# Patient Record
Sex: Female | Born: 2000 | Race: Black or African American | Hispanic: No | Marital: Single | Smoking: Never smoker
Health system: Southern US, Community
[De-identification: ages and names within clinical notes are randomized; demographics above are authoritative.]

## PROBLEM LIST (undated history)

## (undated) ENCOUNTER — Emergency Department (HOSPITAL_COMMUNITY): Payer: Medicaid Other

## (undated) HISTORY — PX: TONSILLECTOMY: SHX5217

---
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
## 2022-03-02 ENCOUNTER — Emergency Department (HOSPITAL_BASED_OUTPATIENT_CLINIC_OR_DEPARTMENT_OTHER): Payer: Self-pay

## 2022-03-02 ENCOUNTER — Encounter (HOSPITAL_BASED_OUTPATIENT_CLINIC_OR_DEPARTMENT_OTHER): Payer: Self-pay

## 2022-03-02 ENCOUNTER — Other Ambulatory Visit: Payer: Self-pay

## 2022-03-02 ENCOUNTER — Inpatient Hospital Stay (HOSPITAL_BASED_OUTPATIENT_CLINIC_OR_DEPARTMENT_OTHER)
Admission: EM | Admit: 2022-03-02 | Discharge: 2022-03-05 | DRG: 419 | Disposition: A | Discharge status: Home / Self Care | Payer: Self-pay | Attending: Internal Medicine | Admitting: Internal Medicine

## 2022-03-02 DIAGNOSIS — D509 Iron deficiency anemia, unspecified: Secondary | ICD-10-CM | POA: Diagnosis present

## 2022-03-02 DIAGNOSIS — K8012 Calculus of gallbladder with acute and chronic cholecystitis without obstruction: Principal | ICD-10-CM | POA: Diagnosis present

## 2022-03-02 DIAGNOSIS — R7401 Elevation of levels of liver transaminase levels: Secondary | ICD-10-CM | POA: Diagnosis present

## 2022-03-02 DIAGNOSIS — E876 Hypokalemia: Secondary | ICD-10-CM | POA: Diagnosis present

## 2022-03-02 DIAGNOSIS — Z68.41 Body mass index (BMI) pediatric, greater than or equal to 95th percentile for age: Secondary | ICD-10-CM

## 2022-03-02 DIAGNOSIS — K219 Gastro-esophageal reflux disease without esophagitis: Secondary | ICD-10-CM | POA: Diagnosis present

## 2022-03-02 DIAGNOSIS — Z79899 Other long term (current) drug therapy: Secondary | ICD-10-CM

## 2022-03-02 DIAGNOSIS — R1011 Right upper quadrant pain: Secondary | ICD-10-CM | POA: Diagnosis present

## 2022-03-02 DIAGNOSIS — I10 Essential (primary) hypertension: Secondary | ICD-10-CM | POA: Diagnosis present

## 2022-03-02 DIAGNOSIS — R7989 Other specified abnormal findings of blood chemistry: Secondary | ICD-10-CM

## 2022-03-02 DIAGNOSIS — K8020 Calculus of gallbladder without cholecystitis without obstruction: Principal | ICD-10-CM

## 2022-03-02 DIAGNOSIS — Z888 Allergy status to other drugs, medicaments and biological substances status: Secondary | ICD-10-CM

## 2022-03-02 DIAGNOSIS — J4530 Mild persistent asthma, uncomplicated: Secondary | ICD-10-CM | POA: Diagnosis present

## 2022-03-02 LAB — CBC WITH DIFFERENTIAL/PLATELET
Abs Immature Granulocytes: 0.02 10*3/uL (ref 0.00–0.07)
Basophils Absolute: 0 10*3/uL (ref 0.0–0.1)
Basophils Relative: 0 %
Eosinophils Absolute: 0.1 10*3/uL (ref 0.0–0.5)
Eosinophils Relative: 1 %
HCT: 32 % — ABNORMAL LOW (ref 36.0–46.0)
Hemoglobin: 9.9 g/dL — ABNORMAL LOW (ref 12.0–15.0)
Immature Granulocytes: 0 %
Lymphocytes Relative: 18 %
Lymphs Abs: 1.1 10*3/uL (ref 0.7–4.0)
MCH: 22.9 pg — ABNORMAL LOW (ref 26.0–34.0)
MCHC: 30.9 g/dL (ref 30.0–36.0)
MCV: 73.9 fL — ABNORMAL LOW (ref 80.0–100.0)
Monocytes Absolute: 0.3 10*3/uL (ref 0.1–1.0)
Monocytes Relative: 6 %
Neutro Abs: 4.4 10*3/uL (ref 1.7–7.7)
Neutrophils Relative %: 75 %
Platelets: 291 10*3/uL (ref 150–400)
RBC: 4.33 MIL/uL (ref 3.87–5.11)
RDW: 15.9 % — ABNORMAL HIGH (ref 11.5–15.5)
WBC: 5.9 10*3/uL (ref 4.0–10.5)
nRBC: 0 % (ref 0.0–0.2)

## 2022-03-02 LAB — COMPREHENSIVE METABOLIC PANEL
ALT: 539 U/L — ABNORMAL HIGH (ref 0–44)
AST: 675 U/L — ABNORMAL HIGH (ref 15–41)
Albumin: 4.3 g/dL (ref 3.5–5.0)
Alkaline Phosphatase: 204 U/L — ABNORMAL HIGH (ref 38–126)
Anion gap: 9 (ref 5–15)
BUN: 7 mg/dL (ref 6–20)
CO2: 24 mmol/L (ref 22–32)
Calcium: 9.6 mg/dL (ref 8.9–10.3)
Chloride: 103 mmol/L (ref 98–111)
Creatinine, Ser: 0.58 mg/dL (ref 0.44–1.00)
GFR, Estimated: >60 mL/min (ref >60)
Glucose, Bld: 110 mg/dL — ABNORMAL HIGH (ref 70–99)
Potassium: 3.2 mmol/L — ABNORMAL LOW (ref 3.5–5.1)
Sodium: 136 mmol/L (ref 135–145)
Total Bilirubin: 3.5 mg/dL — ABNORMAL HIGH (ref 0.3–1.2)
Total Protein: 7.7 g/dL (ref 6.5–8.1)

## 2022-03-02 LAB — LIPID PANEL
Cholesterol: 127 mg/dL (ref 0–200)
HDL: 39 mg/dL — ABNORMAL LOW (ref >40)
LDL Cholesterol: 81 mg/dL (ref 0–99)
Total CHOL/HDL Ratio: 3.3 RATIO
Triglycerides: 35 mg/dL (ref <150)
VLDL: 7 mg/dL (ref 0–40)

## 2022-03-02 LAB — LIPASE, BLOOD: Lipase: 46 U/L (ref 11–51)

## 2022-03-02 LAB — HCG, SERUM, QUALITATIVE: Preg, Serum: NEGATIVE

## 2022-03-02 MED ORDER — TRAZODONE HCL 50 MG PO TABS
25.0000 mg | ORAL_TABLET | Freq: Every evening | ORAL | Status: DC | PRN
  Filled 2022-03-02: qty 1

## 2022-03-02 MED ORDER — SENNOSIDES-DOCUSATE SODIUM 8.6-50 MG PO TABS: 1.0000 | ORAL_TABLET | Freq: Every evening | ORAL | Status: DC | PRN

## 2022-03-02 MED ORDER — SODIUM CHLORIDE 0.9% FLUSH
3.0000 mL | Freq: Two times a day (BID) | INTRAVENOUS | Status: DC
  Administered 2022-03-02 – 2022-03-03 (×2): 3 mL via INTRAVENOUS

## 2022-03-02 MED ORDER — SODIUM CHLORIDE 0.9 % IV BOLUS
1000.0000 mL | Freq: Once | INTRAVENOUS | Status: AC
  Administered 2022-03-02: 1000 mL via INTRAVENOUS

## 2022-03-02 MED ORDER — POTASSIUM CHLORIDE CRYS ER 20 MEQ PO TBCR
20.0000 meq | EXTENDED_RELEASE_TABLET | Freq: Once | ORAL | Status: AC
  Administered 2022-03-02: 20 meq via ORAL
  Filled 2022-03-02: qty 1

## 2022-03-02 MED ORDER — HYDRALAZINE HCL 20 MG/ML IJ SOLN: 5.0000 mg | Freq: Four times a day (QID) | INTRAMUSCULAR | Status: DC | PRN

## 2022-03-02 MED ORDER — ONDANSETRON HCL 4 MG PO TABS: 4.0000 mg | ORAL_TABLET | Freq: Four times a day (QID) | ORAL | Status: DC | PRN

## 2022-03-02 MED ORDER — IBUPROFEN 400 MG PO TABS
400.0000 mg | ORAL_TABLET | Freq: Four times a day (QID) | ORAL | Status: DC | PRN
  Administered 2022-03-03: 400 mg via ORAL
  Filled 2022-03-02: qty 1

## 2022-03-02 MED ORDER — ONDANSETRON HCL 4 MG/2ML IJ SOLN
4.0000 mg | Freq: Four times a day (QID) | INTRAMUSCULAR | Status: DC | PRN
  Administered 2022-03-03: 4 mg via INTRAVENOUS
  Filled 2022-03-02: qty 2

## 2022-03-02 MED ORDER — MORPHINE SULFATE (PF) 2 MG/ML IV SOLN
1.0000 mg | Freq: Four times a day (QID) | INTRAVENOUS | Status: DC | PRN
  Administered 2022-03-03 – 2022-03-04 (×4): 1 mg via INTRAVENOUS
  Filled 2022-03-02 (×4): qty 1

## 2022-03-02 MED ORDER — MORPHINE SULFATE (PF) 4 MG/ML IV SOLN
4.0000 mg | Freq: Once | INTRAVENOUS | Status: AC
  Administered 2022-03-02: 4 mg via INTRAVENOUS
  Filled 2022-03-02: qty 1

## 2022-03-02 MED ORDER — OXYCODONE HCL 5 MG PO TABS
5.0000 mg | ORAL_TABLET | ORAL | Status: DC | PRN
  Administered 2022-03-04 – 2022-03-05 (×3): 5 mg via ORAL
  Filled 2022-03-02 (×3): qty 1

## 2022-03-02 MED ORDER — BISACODYL 5 MG PO TBEC: 5.0000 mg | DELAYED_RELEASE_TABLET | Freq: Every day | ORAL | Status: DC | PRN

## 2022-03-02 MED ORDER — SODIUM CHLORIDE 0.9 % IV SOLN: INTRAVENOUS | Status: DC

## 2022-03-02 MED ORDER — ONDANSETRON HCL 4 MG/2ML IJ SOLN
4.0000 mg | Freq: Once | INTRAMUSCULAR | Status: AC
  Administered 2022-03-02: 4 mg via INTRAVENOUS
  Filled 2022-03-02: qty 2

## 2022-03-02 NOTE — H&P (Signed)
History and Physical   [facility] [place] [facility] Admission History and Physical [employer], D.O.    Patient Name: Jessica Solomon MR#: [id number] Date of Birth: [birth year] Date of Admission: 03/02/2022  Referring MD/NP/PA: Windy Fast, Standard Primary Care Physician: Duard Larsen, MD  Chief Complaint:  Chief Complaint  Patient presents with   Heartburn    HPI: Jessica Solomon is a 22 y.o. female with a known history of 4 months postpartum, asthma, GERD, hypertension, iron deficiency anemia presents to the emergency department for evaluation of two weeks history of right upper quadrant abdominal pain with nausea and vomiting.  Of note recent pregnancy was complicated by HTn for which she took antihypertensives for one week after delivery.  Delivery was vaginal at 39w 6d. She is not breastfeeding.  Patient denies fevers/chills, weakness, dizziness, chest pain, shortness of breath, dysuria/frequency, changes in mental status.    Otherwise there has been no change in status. Patient has been taking medication as prescribed and there has been no recent change in medication or diet.  No recent antibiotics.  There has been no recent illness, hospitalizations, travel or sick contacts.    EMS/ED Course: Patient received for feeding, Zofran, potassium, normal saline. Medical admission has been requested for further management of transaminitis mild hypokalemia.  Review of Systems:  CONSTITUTIONAL: No fever/chills, fatigue, weakness, weight gain/loss, headache. EYES: No blurry or double vision. ENT: No tinnitus, postnasal drip, redness or soreness of the oropharynx. RESPIRATORY: No cough, dyspnea, wheeze.  No hemoptysis.  CARDIOVASCULAR: No chest pain, palpitations, syncope, orthopnea. No lower extremity edema.  GASTROINTESTINAL: Positive nausea, vomiting, abdominal pain, negative diarrhea, constipation.  No hematemesis, melena or hematochezia. GENITOURINARY: No  dysuria, frequency, hematuria. ENDOCRINE: No polyuria or nocturia. No heat or cold intolerance. HEMATOLOGY: No anemia, bruising, bleeding. INTEGUMENTARY: No rashes, ulcers, lesions. MUSCULOSKELETAL: No arthritis, gout. NEUROLOGIC: No numbness, tingling, ataxia, seizure-type activity, weakness. PSYCHIATRIC: No anxiety, depression, insomnia.   Past Medical History:  Diagnosis Date   Asthma     Past Surgical History:  Procedure Laterality Date   TONSILLECTOMY       reports that she has never smoked. She has never used smokeless tobacco. She reports that she does not drink alcohol and does not use drugs.  Allergies  Allergen Reactions   Flonase [Fluticasone Propionate] Other (See Comments)    "nosebleeds"    Family History  Problem Relation Age of Onset   Asthma Neg Hx    Cancer Neg Hx    Diabetes Neg Hx    Hyperlipidemia Neg Hx    Hypertension Neg Hx     Prior to Admission medications   Medication Sig Start Date End Date Taking? Authorizing Provider  acetaminophen (TYLENOL) 325 MG tablet Take 650 mg by mouth 2 (two) times daily as needed for headache.    [provider]  albuterol (VENTOLIN HFA) 108 (90 Base) MCG/ACT inhaler Inhale 2 puffs into the lungs every 6 (six) hours as needed for wheezing or shortness of breath. Patient taking differently: Inhale 2 puffs into the lungs 2 (two) times daily as needed for wheezing or shortness of breath. 05/01/21   Renard Matter, MD  amLODipine (NORVASC) 5 MG tablet Take 1 tablet (5 mg total) by mouth daily. 10/23/21   Deloria Lair, DO  famotidine (PEPCID) 20 MG tablet Take 1 tablet (20 mg total) by mouth 2 (two) times daily at 8 am and 10 pm. Patient not taking: Reported on 10/15/2021 08/31/21   Jorje Guild,  NP  ferrous gluconate (FERGON) 324 MG tablet Take 1 tablet (324 mg total) by mouth daily with breakfast. 09/16/21   Gabriel Carina, CNM  furosemide (LASIX) 20 MG tablet Take 1 tablet (20 mg total) by mouth daily.  10/22/21   Deloria Lair, DO  glycopyrrolate (ROBINUL) 1 MG tablet Take 1 tablet (1 mg total) by mouth 3 (three) times daily. Patient not taking: Reported on 10/15/2021 06/25/21   Gabriel Carina, CNM  ibuprofen (ADVIL) 600 MG tablet Take 1 tablet (600 mg total) by mouth every 6 (six) hours. 10/22/21   Deloria Lair, DO  Prenatal Vit-Fe Fumarate-FA (MULTIVITAMIN-PRENATAL) 27-0.8 MG TABS tablet Take 1 tablet by mouth daily at 12 noon.    [provider]    Physical Exam: Vitals:   03/02/22 1649 03/02/22 1948 03/02/22 1949 03/02/22 2015  BP:   123/77 (!) 106/45  Pulse:   61 (!) 52  Resp:   18   Temp:  98.6 F (37 C)    TempSrc:  Oral    SpO2:   100% 100%  Weight: 121.1 kg     Height: '5\' 6"'$  (1.676 m)       GENERAL: 22 y.o.-year-old female patient, well-developed, well-nourished lying in the bed in no acute distress.  Pleasant and cooperative.   HEENT: Head atraumatic, normocephalic. Pupils equal. Mucus membranes moist. NECK: Supple. No JVD. CHEST: Normal breath sounds bilaterally. No wheezing, rales, rhonchi or crackles. No use of accessory muscles of respiration.  No reproducible chest wall tenderness.  CARDIOVASCULAR: S1, S2 normal. No murmurs, rubs, or gallops. Cap refill <2 seconds. Pulses intact distally.  ABDOMEN: Soft, nondistended, Positive RUQ and LUQ TTP. No rebound, guarding, rigidity. Normoactive bowel sounds present in all four quadrants.  EXTREMITIES: No pedal edema, cyanosis, or clubbing. No calf tenderness or Homan's sign.  NEUROLOGIC: The patient is alert and oriented x 3. Cranial nerves II through XII are grossly intact with no focal sensorimotor deficit. PSYCHIATRIC:  Normal affect, mood, thought content. SKIN: Warm, dry, and intact without obvious rash, lesion, or ulcer.    Labs on Admission:  CBC: Recent Labs  Lab 03/02/22 1720  WBC 5.9  NEUTROABS 4.4  HGB 9.9*  HCT 32.0*  MCV 73.9*  PLT [id number]   Basic Metabolic Panel: Recent Labs  Lab  03/02/22 1720  NA 136  K 3.2*  CL 103  CO2 24  GLUCOSE 110*  BUN 7  CREATININE 0.58  CALCIUM 9.6   GFR: Estimated Creatinine Clearance: 150 mL/min (by C-G formula based on SCr of 0.58 mg/dL). Liver Function Tests: Recent Labs  Lab 03/02/22 1720  AST 675*  ALT 539*  ALKPHOS 204*  BILITOT 3.5*  PROT 7.7  ALBUMIN 4.3   Recent Labs  Lab 03/02/22 1720  LIPASE 46   No results for input(s): "AMMONIA" in the last 168 hours. Coagulation Profile: No results for input(s): "INR", "PROTIME" in the last 168 hours. Cardiac Enzymes: No results for input(s): "CKTOTAL", "CKMB", "CKMBINDEX", "TROPONINI" in the last 168 hours. BNP (last 3 results) No results for input(s): "PROBNP" in the last 8760 hours. HbA1C: No results for input(s): "HGBA1C" in the last 72 hours. CBG: No results for input(s): "GLUCAP" in the last 168 hours. Lipid Profile: No results for input(s): "CHOL", "HDL", "LDLCALC", "TRIG", "CHOLHDL", "LDLDIRECT" in the last 72 hours. Thyroid Function Tests: No results for input(s): "TSH", "T4TOTAL", "FREET4", "T3FREE", "THYROIDAB" in the last 72 hours. Anemia Panel: No results for input(s): "VITAMINB12", "FOLATE", "FERRITIN", "TIBC", "IRON", "RETICCTPCT" in  the last 72 hours. Urine analysis:    Component Value Date/Time   COLORURINE YELLOW 08/31/2021 2151   APPEARANCEUR CLOUDY (A) 08/31/2021 2151   LABSPEC 1.020 08/31/2021 2151   PHURINE 6.0 08/31/2021 2151   GLUCOSEU NEGATIVE 08/31/2021 2151   HGBUR NEGATIVE 08/31/2021 2151   BILIRUBINUR NEGATIVE 08/31/2021 2151   KETONESUR NEGATIVE 08/31/2021 2151   PROTEINUR NEGATIVE 08/31/2021 2151   UROBILINOGEN 0.2 09/10/2012 2251   NITRITE NEGATIVE 08/31/2021 2151   LEUKOCYTESUR MODERATE (A) 08/31/2021 2151   Sepsis Labs: '@LABRCNTIP'$ (procalcitonin:4,lacticidven:4) )No results found for this or any previous visit (from the past 240 hour(s)).   Radiological Exams on Admission: US Abdomen Limited RUQ (LIVER/GB)  Result  Date: 03/02/2022 CLINICAL DATA:  Two views history of right upper quadrant pain EXAM: ULTRASOUND ABDOMEN LIMITED RIGHT UPPER QUADRANT COMPARISON:  None Available. FINDINGS: Gallbladder: There are a few layering gallstones the largest which measures up to 6 mm. No wall thickening visualized. No sonographic Murphy sign noted by sonographer. Common bile duct: Diameter: 5 mm, normal Liver: Limitations: The left hepatic lobe is not visualized and could not be assessed to body habitus. The right hepatic lobe poorly visualized and incompletely assessed for the presence focal hepatic lesions. Portal vein is patent on color Doppler imaging with normal direction of blood flow towards the liver. Other: None. IMPRESSION: Cholelithiasis without sonographic finding to suggest acute cholecystitis. Electronically Signed   By: Marin Roberts M.D.   On: 03/02/2022 18:02     Assessment/Plan  This is a 22 y.o. female with a history of 4 months postpartum, asthma, GERD, hypertension, iron deficiency anemia now being admitted with:  #. Transaminitis, cholelithiasis without acute choly -Admit to inpatient - N.p.o. - IV fluids - Pain control -Check hepatitis and lipid panel - [facility] has been consulted for possible MRCP in a.m.  #. Mild hypokalemia -Received oral replacement in the emergency department -Follow BMP in a.m. - Check magnesium level and replace as needed  #. History of asthma -O2 and nebs as needed  #.  History of iron deficiency anemia at baseline - Monitor CBC   Admission status: Inpatient IV Fluids: NS Diet/Nutrition: NPO Consults called: GI  DVT Px: SCDs and early ambulation. Code Status: Full Code  Disposition Plan: To home in 1-2 days  All the records are reviewed and case discussed with ED provider. Management plans discussed with the patient and/or family who express understanding and agree with plan of care.  Harvie Bridge D.O. on 03/02/2022 at 9:16 PM CC: Primary care  physician; Duard Larsen, MD   03/02/2022, 9:16 PM

## 2022-03-02 NOTE — ED Notes (Signed)
Report called to [place], Therapist, sports.

## 2022-03-02 NOTE — ED Notes (Signed)
Attempted to call report x 2. No answer.

## 2022-03-02 NOTE — ED Triage Notes (Signed)
Patient BIB [facility] from Home.  Endorses for 2 weeks, Reflux that is burning in nature. States it is helpful to bend over. [place] has been not very helpful.  4 Months Postpartum. VSS with EMS.   NAD Noted during Triage. A&Ox4. GCS 15. BIB Wheelchair.

## 2022-03-02 NOTE — Progress Notes (Signed)
Plan of Care Note for accepted transfer   Patient: Jessica Solomon MRN: [id number]   [place]: 03/02/2022  Facility requesting transfer: [facility]. Requesting Provider: Leonie Douglas, PA-EDP. Reason for transfer: Right upper quadrant abdominal pain.  Facility course: The patient is a 22 year old female with past medical history significant for 4 months postpartum, hypertension, iron deficiency anemia, GERD, who presented to the ED with complaints of right upper quadrant abdominal pain.  Associated with intermittent nausea and vomiting, indigestion for the past 2 weeks.  Symptoms worsened today.  Over-the-counter Mylanta has not been helpful.  No reported subjective fevers.  In the ED, lab studies were notable for elevated liver chemistries with T. bili of 3.5.  Right upper quadrant abdominal ultrasound revealed cholelithiasis without sonographic finding to suggest acute cholecystitis.  EDP discussed the case with Dr. Watt Climes, [facility], who will see in consultation.  The patient might require MRCP.  Admitted to [facility] to MedSurg unit as observation status.  Plan of care: The patient is accepted for admission to [facility]  unit, at [facility].  Observation status.  Author: Kayleen Memos, DO 03/02/2022  Check [email or web address] for on-call coverage.  Nursing staff, Please call [place] number on Amion as soon as patient's arrival, so appropriate admitting provider can evaluate the pt.

## 2022-03-02 NOTE — ED Notes (Signed)
[facility] here for patient transport.

## 2022-03-02 NOTE — ED Notes (Signed)
Report given to [facility], attempted report to accepting nurse x 1, will call back.

## 2022-03-02 NOTE — ED Provider Notes (Signed)
[facility] Provider Note   CSN: [id number] Arrival date & time: 03/02/22  1644     History  Chief Complaint  Patient presents with   Heartburn    Jessica Solomon is a 22 y.o. female with a past medical history significant for asthma who presents to the ED due to upper abdominal pain associated with nausea and vomiting which has been intermittent for the past 2 weeks.  Patient states pain worsened yesterday after eating a chicken sandwich.  Patient states pain radiates to back.  Patient is currently 4 months postpartum.  Not breast-feeding.  No previous abdominal operations.  Admits to numerous episodes of nonbloody, nonbilious emesis last night.  Denies alcohol use.  No chronic NSAIDs.  Denies melena and hematochezia.  No fever or chills.  Patient states she believed her pain was related to reflux and took Mylanta last night with no relief in symptoms.  History obtained from patient and past medical records. No interpreter used during encounter.       Home Medications Prior to Admission medications   Medication Sig Start Date End Date Taking? Authorizing Provider  acetaminophen (TYLENOL) 325 MG tablet Take 650 mg by mouth 2 (two) times daily as needed for headache.    [provider]  albuterol (VENTOLIN HFA) 108 (90 Base) MCG/ACT inhaler Inhale 2 puffs into the lungs every 6 (six) hours as needed for wheezing or shortness of breath. Patient taking differently: Inhale 2 puffs into the lungs 2 (two) times daily as needed for wheezing or shortness of breath. 05/01/21   Renard Matter, MD  amLODipine (NORVASC) 5 MG tablet Take 1 tablet (5 mg total) by mouth daily. 10/23/21   Deloria Lair, DO  famotidine (PEPCID) 20 MG tablet Take 1 tablet (20 mg total) by mouth 2 (two) times daily at 8 am and 10 pm. Patient not taking: Reported on 10/15/2021 08/31/21   Jorje Guild, NP  ferrous gluconate (FERGON) 324 MG tablet Take 1 tablet (324 mg total)  by mouth daily with breakfast. 09/16/21   Gabriel Carina, CNM  furosemide (LASIX) 20 MG tablet Take 1 tablet (20 mg total) by mouth daily. 10/22/21   Deloria Lair, DO  glycopyrrolate (ROBINUL) 1 MG tablet Take 1 tablet (1 mg total) by mouth 3 (three) times daily. Patient not taking: Reported on 10/15/2021 06/25/21   Gabriel Carina, CNM  ibuprofen (ADVIL) 600 MG tablet Take 1 tablet (600 mg total) by mouth every 6 (six) hours. 10/22/21   Deloria Lair, DO  Prenatal Vit-Fe Fumarate-FA (MULTIVITAMIN-PRENATAL) 27-0.8 MG TABS tablet Take 1 tablet by mouth daily at 12 noon.    [provider]      Allergies    Flonase [fluticasone propionate]    Review of Systems   Review of Systems  Constitutional:  Negative for chills and fever.  Respiratory:  Negative for shortness of breath.   Cardiovascular:  Negative for chest pain.  Gastrointestinal:  Positive for abdominal pain, nausea and vomiting.  Musculoskeletal:  Positive for back pain.    Physical Exam Updated Vital Signs BP (!) 106/45   Pulse (!) 52   Temp 98.6 F (37 C) (Oral)   Resp 18   Ht '5\' 6"'$  (1.676 m)   Wt 121.1 kg   SpO2 100%   BMI 43.09 kg/m  Physical Exam Vitals and nursing note reviewed.  Constitutional:      General: She is not in acute distress.    Appearance: She is  not ill-appearing.  HENT:     Head: Normocephalic.  Eyes:     Pupils: Pupils are equal, round, and reactive to light.  Cardiovascular:     Rate and Rhythm: Normal rate and regular rhythm.     Pulses: Normal pulses.     Heart sounds: Normal heart sounds. No murmur heard.    No friction rub. No gallop.  Pulmonary:     Effort: Pulmonary effort is normal.     Breath sounds: Normal breath sounds.  Abdominal:     General: Abdomen is flat. There is no distension.     Palpations: Abdomen is soft.     Tenderness: There is abdominal tenderness. There is no guarding or rebound.     Comments: RUQ and epigastric tenderness  Musculoskeletal:         General: Normal range of motion.     Cervical back: Neck supple.  Skin:    General: Skin is warm and dry.  Neurological:     General: No focal deficit present.     Mental Status: She is alert.  Psychiatric:        Mood and Affect: Mood normal.        Behavior: Behavior normal.     ED Results / Procedures / Treatments   Labs (all labs ordered are listed, but only abnormal results are displayed) Labs Reviewed  CBC WITH DIFFERENTIAL/PLATELET - Abnormal; Notable for the following components:      Result Value   Hemoglobin 9.9 (*)    HCT 32.0 (*)    MCV 73.9 (*)    MCH 22.9 (*)    RDW 15.9 (*)    All other components within normal limits  COMPREHENSIVE METABOLIC PANEL - Abnormal; Notable for the following components:   Potassium 3.2 (*)    Glucose, Bld 110 (*)    AST 675 (*)    ALT 539 (*)    Alkaline Phosphatase 204 (*)    Total Bilirubin 3.5 (*)    All other components within normal limits  LIPASE, BLOOD  HCG, SERUM, QUALITATIVE    EKG EKG Interpretation  Date/Time:  Monday March 02 2022 17:05:28 EST Ventricular Rate:  69 PR Interval:  125 QRS Duration: 92 QT Interval:  402 QTC Calculation: 431 R Axis:   70 Text Interpretation: Sinus rhythm Atrial premature complexes in couplets No significant change since prior 2/23 Confirmed by Aletta Edouard [id number]) on 03/02/2022 5:06:25 PM  Radiology US Abdomen Limited RUQ (LIVER/GB)  Result Date: 03/02/2022 CLINICAL DATA:  Two views history of right upper quadrant pain EXAM: ULTRASOUND ABDOMEN LIMITED RIGHT UPPER QUADRANT COMPARISON:  None Available. FINDINGS: Gallbladder: There are a few layering gallstones the largest which measures up to 6 mm. No wall thickening visualized. No sonographic Murphy sign noted by sonographer. Common bile duct: Diameter: 5 mm, normal Liver: Limitations: The left hepatic lobe is not visualized and could not be assessed to body habitus. The right hepatic lobe poorly visualized and  incompletely assessed for the presence focal hepatic lesions. Portal vein is patent on color Doppler imaging with normal direction of blood flow towards the liver. Other: None. IMPRESSION: Cholelithiasis without sonographic finding to suggest acute cholecystitis. Electronically Signed   By: Marin Roberts M.D.   On: 03/02/2022 18:02    Procedures Procedures    Medications Ordered in ED Medications  ondansetron (ZOFRAN) injection 4 mg (4 mg Intravenous Given 03/02/22 1723)  sodium chloride 0.9 % bolus 1,000 mL (0 mLs Intravenous Stopped 03/02/22 1824)  potassium chloride SA (KLOR-CON M) CR tablet 20 mEq (20 mEq Oral Given 03/02/22 1831)  morphine (PF) 4 MG/ML injection 4 mg (4 mg Intravenous Given 03/02/22 1959)    ED Course/ Medical Decision Making/ A&P Clinical Course as of 03/02/22 2026  Mon Mar 02, 2022  1749 Hemoglobin(!): 9.9 [CA]  1805 Potassium(!): 3.2 [CA]  1806 ALT(!): 539 [CA]  1806 Alkaline Phosphatase(!): 204 [CA]  1806 Lipase: 46 [CA]  1821 Discussed with Dr. Watt Climes with GI who recommends MRCP. He notes if patient is feeling better, no longer tender, and tolerating po, can be discharged and schedule MRCP in outpatient setting. If not, medicine admit and GI will consult.  [CA]    Clinical Course User Index [CA] Suzy Bouchard, PA-C                             Medical Decision Making Amount and/or Complexity of Data Reviewed Independent Historian: parent Labs: ordered. Decision-making details documented in ED Course. Radiology: ordered and independent interpretation performed. Decision-making details documented in ED Course. ECG/medicine tests: ordered and independent interpretation performed. Decision-making details documented in ED Course.  Risk Prescription drug management. Decision regarding hospitalization.   This patient presents to the ED for concern of upper abdominal pain, this involves an extensive number of treatment options, and is a complaint that  carries with it a high risk of complications and morbidity.  The differential diagnosis includes acute cholecystitis, gallstones, pancreatitis, atypical ACS, etc  22 year old female presents to the ED due to intermittent upper abdominal pain x 2 weeks.  Pain worsened last night after eating a chicken sandwich.  Pain radiates to back associated with nausea and vomiting.  Currently 4 months postpartum.  Not breast-feeding.  Upon arrival, patient afebrile, not tachycardic or hypoxic.  Patient in no acute distress.  Physical exam significant for tenderness throughout right upper quadrant and epigastric region.  Routine labs ordered to check LFTs and lipase.  Right upper quadrant ultrasound to rule out evidence of acute cholecystitis versus cholelithiasis.  Lipase to rule out pancreatitis. Iv zofran and IVFs given. EKG to rule out atypical ACS.  CBC with no leukocytosis.  Hemoglobin at 9.9 which appears to be around patient's baseline.  CMP significant for hypokalemia at 3.2.  Potassium repleted here in the ED.  Transaminitis.  Negative pregnancy test.  Lipase normal.  Doubt pancreatitis.  Ultrasound demonstrates cholelithiasis without evidence of acute cholecystitis.  Discussed with Dr. Watt Climes with [facility].  See note above.  EKG demonstrates normal sinus rhythm.  No signs of acute ischemia.  Low suspicion for atypical ACS. Will admit to medicine for MRCP and further work-up of RUQ pain. Discussed with Dr. Melina Copa who agrees with assessment and plan.  Reassessed patient who notes improvement in symptoms after IVFs and zofran.   7:35 PM Discussed with Dr. Nevada Crane with [facility] who agrees to admit patient.   Has PCP Hx asthma       Final Clinical Impression(s) / ED Diagnoses Final diagnoses:  Gallstones  Elevated LFTs    Rx / DC Orders ED Discharge Orders     None         Karie Kirks 03/02/22 2026    Hayden Rasmussen, MD 03/03/22 [phone]

## 2022-03-02 NOTE — ED Notes (Signed)
Attempted to call report x 3, no answer.

## 2022-03-03 ENCOUNTER — Encounter (HOSPITAL_COMMUNITY): Payer: Self-pay | Admitting: Family Medicine

## 2022-03-03 ENCOUNTER — Inpatient Hospital Stay (HOSPITAL_COMMUNITY): Payer: Self-pay

## 2022-03-03 DIAGNOSIS — R7989 Other specified abnormal findings of blood chemistry: Secondary | ICD-10-CM

## 2022-03-03 DIAGNOSIS — J45909 Unspecified asthma, uncomplicated: Secondary | ICD-10-CM | POA: Insufficient documentation

## 2022-03-03 DIAGNOSIS — K8020 Calculus of gallbladder without cholecystitis without obstruction: Secondary | ICD-10-CM

## 2022-03-03 LAB — COMPREHENSIVE METABOLIC PANEL
ALT: 382 U/L — ABNORMAL HIGH (ref 0–44)
AST: 369 U/L — ABNORMAL HIGH (ref 15–41)
Albumin: 3.5 g/dL (ref 3.5–5.0)
Alkaline Phosphatase: 173 U/L — ABNORMAL HIGH (ref 38–126)
Anion gap: 5 (ref 5–15)
BUN: 6 mg/dL (ref 6–20)
CO2: 23 mmol/L (ref 22–32)
Calcium: 8.4 mg/dL — ABNORMAL LOW (ref 8.9–10.3)
Chloride: 110 mmol/L (ref 98–111)
Creatinine, Ser: 0.62 mg/dL (ref 0.44–1.00)
GFR, Estimated: >60 mL/min (ref >60)
Glucose, Bld: 91 mg/dL (ref 70–99)
Potassium: 3.7 mmol/L (ref 3.5–5.1)
Sodium: 138 mmol/L (ref 135–145)
Total Bilirubin: 3.4 mg/dL — ABNORMAL HIGH (ref 0.3–1.2)
Total Protein: 6.8 g/dL (ref 6.5–8.1)

## 2022-03-03 LAB — URINALYSIS, ROUTINE W REFLEX MICROSCOPIC
Bilirubin Urine: NEGATIVE
Glucose, UA: NEGATIVE mg/dL
Hgb urine dipstick: NEGATIVE
Ketones, ur: NEGATIVE mg/dL
Leukocytes,Ua: NEGATIVE
Nitrite: NEGATIVE
Protein, ur: NEGATIVE mg/dL
Specific Gravity, Urine: 1.016 (ref 1.005–1.030)
pH: 5 (ref 5.0–8.0)

## 2022-03-03 LAB — VITAMIN B12: Vitamin B-12: 673 pg/mL (ref 180–914)

## 2022-03-03 LAB — SURGICAL PCR SCREEN
MRSA, PCR: NEGATIVE
Staphylococcus aureus: POSITIVE — AB

## 2022-03-03 LAB — RETICULOCYTES
Immature Retic Fract: 25.9 % — ABNORMAL HIGH (ref 2.3–15.9)
RBC.: 4.04 MIL/uL (ref 3.87–5.11)
Retic Count, Absolute: 116.4 10*3/uL (ref 19.0–186.0)
Retic Ct Pct: 2.9 % (ref 0.4–3.1)

## 2022-03-03 LAB — IRON AND TIBC
Iron: 87 ug/dL (ref 28–170)
Saturation Ratios: 18 % (ref 10.4–31.8)
TIBC: 490 ug/dL — ABNORMAL HIGH (ref 250–450)
UIBC: 403 ug/dL

## 2022-03-03 LAB — FOLATE: Folate: 13.1 ng/mL (ref >5.9)

## 2022-03-03 LAB — FERRITIN: Ferritin: 13 ng/mL (ref 11–307)

## 2022-03-03 LAB — CBC
HCT: 34 % — ABNORMAL LOW (ref 36.0–46.0)
Hemoglobin: 9.3 g/dL — ABNORMAL LOW (ref 12.0–15.0)
MCH: 23.1 pg — ABNORMAL LOW (ref 26.0–34.0)
MCHC: 27.4 g/dL — ABNORMAL LOW (ref 30.0–36.0)
MCV: 84.6 fL (ref 80.0–100.0)
Platelets: 220 10*3/uL (ref 150–400)
RBC: 4.02 MIL/uL (ref 3.87–5.11)
RDW: 16.2 % — ABNORMAL HIGH (ref 11.5–15.5)
WBC: 5.2 10*3/uL (ref 4.0–10.5)
nRBC: 0 % (ref 0.0–0.2)

## 2022-03-03 LAB — HEPATITIS PANEL, ACUTE
HCV Ab: NONREACTIVE
Hep A IgM: NONREACTIVE
Hep B C IgM: NONREACTIVE
Hepatitis B Surface Ag: NONREACTIVE

## 2022-03-03 MED ORDER — GADOBUTROL 1 MMOL/ML IV SOLN
10.0000 mL | Freq: Once | INTRAVENOUS | Status: AC | PRN
  Administered 2022-03-03: 10 mL via INTRAVENOUS

## 2022-03-03 MED ORDER — CHLORHEXIDINE GLUCONATE CLOTH 2 % EX PADS: 6.0000 | MEDICATED_PAD | Freq: Once | CUTANEOUS | Status: AC

## 2022-03-03 MED ORDER — CHLORHEXIDINE GLUCONATE CLOTH 2 % EX PADS
6.0000 | MEDICATED_PAD | Freq: Every day | CUTANEOUS | Status: DC
  Administered 2022-03-04 – 2022-03-05 (×2): 6 via TOPICAL

## 2022-03-03 MED ORDER — MUPIROCIN 2 % EX OINT
1.0000 | TOPICAL_OINTMENT | Freq: Two times a day (BID) | CUTANEOUS | Status: DC
  Administered 2022-03-05: 1 via NASAL
  Filled 2022-03-03 (×2): qty 22

## 2022-03-03 MED ORDER — SODIUM CHLORIDE 0.9 % IV SOLN
2.0000 g | INTRAVENOUS | Status: AC
  Administered 2022-03-04: 2 g via INTRAVENOUS
  Filled 2022-03-03: qty 20

## 2022-03-03 MED ORDER — CHLORHEXIDINE GLUCONATE CLOTH 2 % EX PADS
6.0000 | MEDICATED_PAD | Freq: Once | CUTANEOUS | Status: AC
  Administered 2022-03-03: 6 via TOPICAL

## 2022-03-03 NOTE — Consult Note (Signed)
Consult Note  Jessica Solomon 2000/07/23  [id number].    Requesting MD: Dr. Tana Coast Chief Complaint/Reason for Consult: abdominal pain  HPI:  22 y.o. female with medical history significant for asthma who presented to [facility] ED 2/26 with abdominal pain associated with nausea and vomiting. She is 4 months post partum. Symptoms have been present intermittently for 2 weeks. Pain acutely worsened the day prior to ED presentation after eating a chicken sandwich. Pain is across her upper abdomen and radiates into her back. She has had associated nonbloody emesis. She took mylanta without without relief.  Work up in ED significant for elevated LFTs with AST 675, ALT 539, Alk phos 204, Tbili 3.5. US showed cholelithiasis without cholecystitis and CBD 5m. She has been admitted to medicine service. GI consulted as well with plans for MRCP.  Today her abdominal pain is improved but she continues to have pain requiring pain medication. She is no longer n/v.  Substance use: none Blood thinners: none Past Surgeries: no prior abdominal surgeries    ROS: ROS reviewed and as above otherwise negative.  Family History  Problem Relation Age of Onset   Asthma Neg Hx    Cancer Neg Hx    Diabetes Neg Hx    Hyperlipidemia Neg Hx    Hypertension Neg Hx     Past Medical History:  Diagnosis Date   Asthma     Past Surgical History:  Procedure Laterality Date   TONSILLECTOMY      Social History:  reports that she has never smoked. She has never used smokeless tobacco. She reports that she does not drink alcohol and does not use drugs.  Allergies:  Allergies  Allergen Reactions   Flonase [Fluticasone Propionate] Other (See Comments)    "nosebleeds"    Medications Prior to Admission  Medication Sig Dispense Refill   acetaminophen (TYLENOL) 325 MG tablet Take 650 mg by mouth 2 (two) times daily as needed for headache.     albuterol (VENTOLIN HFA) 108 (90 Base) MCG/ACT inhaler Inhale 2  puffs into the lungs every 6 (six) hours as needed for wheezing or shortness of breath. (Patient taking differently: Inhale 2 puffs into the lungs 2 (two) times daily as needed for wheezing or shortness of breath.) 8 g 2   amLODipine (NORVASC) 5 MG tablet Take 1 tablet (5 mg total) by mouth daily. 30 tablet 0   famotidine (PEPCID) 20 MG tablet Take 1 tablet (20 mg total) by mouth 2 (two) times daily at 8 am and 10 pm. (Patient not taking: Reported on 10/15/2021) 60 tablet 1   ferrous gluconate (FERGON) 324 MG tablet Take 1 tablet (324 mg total) by mouth daily with breakfast. 30 tablet 3   furosemide (LASIX) 20 MG tablet Take 1 tablet (20 mg total) by mouth daily. 5 tablet 0   glycopyrrolate (ROBINUL) 1 MG tablet Take 1 tablet (1 mg total) by mouth 3 (three) times daily. (Patient not taking: Reported on 10/15/2021) 90 tablet 0   ibuprofen (ADVIL) 600 MG tablet Take 1 tablet (600 mg total) by mouth every 6 (six) hours. 30 tablet 0   Prenatal Vit-Fe Fumarate-FA (MULTIVITAMIN-PRENATAL) 27-0.8 MG TABS tablet Take 1 tablet by mouth daily at 12 noon.      Blood pressure 135/73, pulse (!) 54, temperature 98.1 F (36.7 C), temperature source Oral, resp. rate 18, height '5\' 6"'$  (1.676 m), weight 121.1 kg, SpO2 100 %, unknown if currently breastfeeding. Physical Exam: General: pleasant, WD, female who  is laying in bed in NAD HEENT: head is normocephalic, atraumatic.  Sclera are noninjected.  Pupils equal and round. EOMs intact.  Ears and nose without any masses or lesions.  Mouth is pink and moist Heart: regular, rate, and rhythm Lungs: CTAB, no wheezes, rhonchi, or rales noted.  Respiratory effort nonlabored Abd: soft, ND, +BS, no masses, hernias, or organomegaly. Mild RUQ and epigastric tenderness without rebound or guarding MSK: all 4 extremities are symmetrical with no cyanosis, clubbing, or edema. Skin: warm and dry with no masses, lesions, or rashes Neuro: Cranial nerves 2-12 grossly intact, sensation  is normal throughout Psych: A&Ox3 with an appropriate affect.    Results for orders placed or performed during the hospital encounter of 03/02/22 (from the past 48 hour(s))  CBC with Differential     Status: Abnormal   Collection Time: 03/02/22  5:20 PM  Result Value Ref Range   WBC 5.9 4.0 - 10.5 K/uL   RBC 4.33 3.87 - 5.11 MIL/uL   Hemoglobin 9.9 (L) 12.0 - 15.0 g/dL   HCT 32.0 (L) 36.0 - 46.0 %   MCV 73.9 (L) 80.0 - 100.0 fL   MCH 22.9 (L) 26.0 - 34.0 pg   MCHC 30.9 30.0 - 36.0 g/dL   RDW 15.9 (H) 11.5 - 15.5 %   Platelets 291 150 - 400 K/uL   nRBC 0.0 0.0 - 0.2 %   Neutrophils Relative % 75 %   Neutro Abs 4.4 1.7 - 7.7 K/uL   Lymphocytes Relative 18 %   Lymphs Abs 1.1 0.7 - 4.0 K/uL   Monocytes Relative 6 %   Monocytes Absolute 0.3 0.1 - 1.0 K/uL   Eosinophils Relative 1 %   Eosinophils Absolute 0.1 0.0 - 0.5 K/uL   Basophils Relative 0 %   Basophils Absolute 0.0 0.0 - 0.1 K/uL   Immature Granulocytes 0 %   Abs Immature Granulocytes 0.02 0.00 - 0.07 K/uL    Comment: Performed at [facility], [place], [place] [address]  Comprehensive metabolic panel     Status: Abnormal   Collection Time: 03/02/22  5:20 PM  Result Value Ref Range   Sodium 136 135 - 145 mmol/L   Potassium 3.2 (L) 3.5 - 5.1 mmol/L   Chloride 103 98 - 111 mmol/L   CO2 24 22 - 32 mmol/L   Glucose, Bld 110 (H) 70 - 99 mg/dL    Comment: Glucose reference range applies only to samples taken after fasting for at least 8 hours.   BUN 7 6 - 20 mg/dL   Creatinine, Ser 0.58 0.44 - 1.00 mg/dL   Calcium 9.6 8.9 - 10.3 mg/dL   Total Protein 7.7 6.5 - 8.1 g/dL   Albumin 4.3 3.5 - 5.0 g/dL   AST 675 (H) 15 - 41 U/L   ALT 539 (H) 0 - 44 U/L   Alkaline Phosphatase 204 (H) 38 - 126 U/L   Total Bilirubin 3.5 (H) 0.3 - 1.2 mg/dL   GFR, Estimated >60 >60 mL/min    Comment: (NOTE) Calculated using the CKD-EPI Creatinine Equation (2021)    Anion gap 9 5 - 15    Comment: Performed  at [facility], [place], [place] [address]  Lipase, blood     Status: None   Collection Time: 03/02/22  5:20 PM  Result Value Ref Range   Lipase 46 11 - 51 U/L    Comment: Performed at [facility], [address],  [place], [place] [address]  hCG, serum, qualitative     Status: None   Collection Time: 03/02/22  5:20 PM  Result Value Ref Range   Preg, Serum NEGATIVE NEGATIVE    Comment:        THE SENSITIVITY OF THIS METHODOLOGY IS >10 mIU/mL. Performed at [facility], [address], [place], [place] [address]   Lipid panel     Status: Abnormal   Collection Time: 03/02/22 10:07 PM  Result Value Ref Range   Cholesterol 127 0 - 200 mg/dL   Triglycerides 35 <150 mg/dL   HDL 39 (L) >40 mg/dL   Total CHOL/HDL Ratio 3.3 RATIO   VLDL 7 0 - 40 mg/dL   LDL Cholesterol 81 0 - 99 mg/dL    Comment:        Total Cholesterol/HDL:CHD Risk Coronary Heart Disease Risk Table                     Men   Women  1/2 Average Risk   3.4   3.3  Average Risk       5.0   4.4  2 X Average Risk   9.6   7.1  3 X Average Risk  23.4   11.0        Use the calculated Patient Ratio above and the CHD Risk Table to determine the patient's CHD Risk.        ATP III CLASSIFICATION (LDL):  <100     mg/dL   Optimal  100-129  mg/dL   Near or Above                    Optimal  130-159  mg/dL   Borderline  160-189  mg/dL   High  >190     mg/dL   Very High Performed at [facility] [address], [place], [place] [address]   Urinalysis, Routine w reflex microscopic -Urine, Clean Catch     Status: Abnormal   Collection Time: 03/03/22  2:15 AM  Result Value Ref Range   Color, Urine AMBER (A) YELLOW    Comment: BIOCHEMICALS MAY BE AFFECTED BY COLOR   APPearance CLEAR CLEAR   Specific Gravity, Urine 1.016 1.005 - 1.030   pH 5.0 5.0 - 8.0   Glucose, UA NEGATIVE NEGATIVE mg/dL   Hgb urine dipstick NEGATIVE NEGATIVE    Bilirubin Urine NEGATIVE NEGATIVE   Ketones, ur NEGATIVE NEGATIVE mg/dL   Protein, ur NEGATIVE NEGATIVE mg/dL   Nitrite NEGATIVE NEGATIVE   Leukocytes,Ua NEGATIVE NEGATIVE    Comment: Performed at [facility], [place] [address] [place], [place] [address]  Comprehensive metabolic panel     Status: Abnormal   Collection Time: 03/03/22  4:49 AM  Result Value Ref Range   Sodium 138 135 - 145 mmol/L   Potassium 3.7 3.5 - 5.1 mmol/L   Chloride 110 98 - 111 mmol/L   CO2 23 22 - 32 mmol/L   Glucose, Bld 91 70 - 99 mg/dL    Comment: Glucose reference range applies only to samples taken after fasting for at least 8 hours.   BUN 6 6 - 20 mg/dL   Creatinine, Ser 0.62 0.44 - 1.00 mg/dL   Calcium 8.4 (L) 8.9 - 10.3 mg/dL   Total Protein 6.8 6.5 - 8.1 g/dL   Albumin 3.5 3.5 - 5.0 g/dL   AST 369 (H) 15 - 41 U/L    Comment: RESULT CONFIRMED BY  MANUAL DILUTION   ALT 382 (H) 0 - 44 U/L   Alkaline Phosphatase 173 (H) 38 - 126 U/L   Total Bilirubin 3.4 (H) 0.3 - 1.2 mg/dL   GFR, Estimated >60 >60 mL/min    Comment: (NOTE) Calculated using the CKD-EPI Creatinine Equation (2021)    Anion gap 5 5 - 15    Comment: Performed at [facility], [place] [address] [place], [place] [address]  CBC     Status: Abnormal   Collection Time: 03/03/22  4:49 AM  Result Value Ref Range   WBC 5.2 4.0 - 10.5 K/uL   RBC 4.02 3.87 - 5.11 MIL/uL   Hemoglobin 9.3 (L) 12.0 - 15.0 g/dL   HCT 34.0 (L) 36.0 - 46.0 %   MCV 84.6 80.0 - 100.0 fL   MCH 23.1 (L) 26.0 - 34.0 pg   MCHC 27.4 (L) 30.0 - 36.0 g/dL   RDW 16.2 (H) 11.5 - 15.5 %   Platelets 220 150 - 400 K/uL   nRBC 0.0 0.0 - 0.2 %    Comment: Performed at [facility], [place] [address], [place], [place] [address]   US Abdomen Limited RUQ (LIVER/GB)  Result Date: 03/02/2022 CLINICAL DATA:  Two views history of right upper quadrant pain EXAM: ULTRASOUND ABDOMEN LIMITED RIGHT UPPER QUADRANT COMPARISON:  None  Available. FINDINGS: Gallbladder: There are a few layering gallstones the largest which measures up to 6 mm. No wall thickening visualized. No sonographic Murphy sign noted by sonographer. Common bile duct: Diameter: 5 mm, normal Liver: Limitations: The left hepatic lobe is not visualized and could not be assessed to body habitus. The right hepatic lobe poorly visualized and incompletely assessed for the presence focal hepatic lesions. Portal vein is patent on color Doppler imaging with normal direction of blood flow towards the liver. Other: None. IMPRESSION: Cholelithiasis without sonographic finding to suggest acute cholecystitis. Electronically Signed   By: Marin Roberts M.D.   On: 03/02/2022 18:02      Assessment/Plan Cholelithiasis Abnormal LFTs, T bili 3.4 (3.5) - Patient seen and examined and relative labs and imaging reviewed. She does not have radiographic findings of cholecystitis but does have cholelithiasis and symptoms of biliary colic at the very least. GI consulted as well and MRCP pending for further evaluation of possible choledocholithiasis. If positive they will follow for ERCP. Recommend laparoscopic cholecystectomy this admission with timing to be determined pending MRCP/ERCP. We will follow.  FEN: NPO ID: none VTE: okay for chemical prophylaxis from surgical standpoint   I reviewed Consultant gastroenterology notes, hospitalist notes, last 24 h vitals and pain scores, last 48 h intake and output, last 24 h labs and trends, and last 24 h imaging results   Margie Billet, [facility] Surgery 03/03/2022, 11:04 AM Please see Amion for pager number during day hours 7:00am-4:30pm

## 2022-03-03 NOTE — Anesthesia Preprocedure Evaluation (Signed)
Anesthesia Evaluation  Patient identified by MRN, date of birth, ID band Patient awake    Reviewed: Allergy & Precautions, H&P , NPO status , Patient's Chart, lab work & pertinent test results  Airway Mallampati: II  TM Distance: >3 FB Neck ROM: Full    Dental no notable dental hx. (+) Teeth Intact, Dental Advisory Given   Pulmonary asthma    Pulmonary exam normal breath sounds clear to auscultation       Cardiovascular Exercise Tolerance: Good negative cardio ROS  Rhythm:Regular Rate:Normal     Neuro/Psych negative neurological ROS  negative psych ROS   GI/Hepatic negative GI ROS, Neg liver ROS,,,  Endo/Other    Morbid obesity  Renal/GU negative Renal ROS  negative genitourinary   Musculoskeletal   Abdominal   Peds  Hematology negative hematology ROS (+)   Anesthesia Other Findings   Reproductive/Obstetrics negative OB ROS                             Anesthesia Physical Anesthesia Plan  ASA: 3  Anesthesia Plan: General   Post-op Pain Management: Toradol IV (intra-op)*   Induction: Intravenous  PONV Risk Score and Plan: 4 or greater and Ondansetron, Dexamethasone and Midazolam  Airway Management Planned: Oral ETT  Additional Equipment:   Intra-op Plan:   Post-operative Plan: Extubation in OR  Informed Consent: I have reviewed the patients History and Physical, chart, labs and discussed the procedure including the risks, benefits and alternatives for the proposed anesthesia with the patient or authorized representative who has indicated his/her understanding and acceptance.     Dental advisory given  Plan Discussed with: CRNA  Anesthesia Plan Comments:        Anesthesia Quick Evaluation

## 2022-03-03 NOTE — Progress Notes (Signed)
[facility]                                                                              Jessica Solomon, is a 22 y.o. female, DOB - 05-07-2000, [id number] Admit date - 03/02/2022    Outpatient Primary MD for the patient is Duard Larsen, MD  LOS - 1  days  Chief Complaint  Patient presents with   Heartburn       Brief summary   Patient is a 22 year old female, 4 months postpartum, currently not breast-feeding, presented with right upper quadrant abdominal pain, nausea vomiting but no fevers.   In ED, found to have elevated LFTs and hyperbilirubinemia, underwent ultrasound which showed cholelithiasis without acute cholecystitis with CBD of 5 mm. Patient was admitted for further workup   Assessment & Plan    Principal Problem:   RUQ abdominal pain with acute transaminitis, cholelithiasis -Ultrasound showed cholelithiasis without acute cholecystitis, CBD 5 mm. -Follow acute hepatitis panel, lipase normal. - LFTs improving, continue n.p.o. status, pain control, IV fluids, antiemetics as needed - awaiting MRCP.  GI consulted, if CBD stone noted, will need ERCP -Surgery consulted, may need cholecystectomy  Active Problems:   Asthma, mild persistent -Currently stable, no wheezing  Microcytic anemia -Hemoglobin 9 3, obtain anemia panel  Hypokalemia -Replaced  Obesity Estimated body mass index is 43.09 kg/m as calculated from the following:   Height as of this encounter: '5\' 6"'$  (1.676 m).   Weight as of this encounter: 121.1 kg.  Code Status: Full code DVT Prophylaxis:  SCDs Start: 03/02/22 2153   Level of Care: Level of care: Med-Surg Family Communication: Updated patient Disposition Plan:      Remains inpatient appropriate:   pending workup   Procedures:  Right upper quadrant ultrasound  Consultants:   GI, [facility], Dr. Therisa Doyne General surgery  Antimicrobials:   Anti-infectives (From admission, onward)    None           Medications  sodium chloride flush  3 mL Intravenous Q12H      Subjective:   Jessica Solomon was seen and examined today.  Still having right upper quadrant abdominal pain, no nausea or vomiting.  No fevers.    Objective:   Vitals:   03/02/22 2219 03/03/22 0135 03/03/22 0518 03/03/22 0948  BP: 131/75 (!) 107/47 (!) 111/58 135/73  Pulse: 61 (!) 58 (!) 51 (!) 54  Resp: '16 16 16 18  '$ Temp: 97.8 F (36.6 C) 97.6 F (36.4 C) 97.6 F (36.4 C) 98.1 F (36.7 C)  TempSrc: Oral Oral Oral Oral  SpO2: 100% 100% 100% 100%  Weight:      Height:        Intake/Output Summary (Last 24 hours) at 03/03/2022 1033 Last data filed at 03/03/2022 1000 Gross per 24 hour  Intake 1939.63 ml  Output 600 ml  Net 1339.63 ml     Wt Readings from Last 3 Encounters:  03/02/22 121.1 kg (>99 %, Z= 2.65)*  10/20/21 121.1 kg (>99 %, Z= 2.63)*  10/19/21 120.5 kg (>99 %, Z= 2.62)*   * Growth percentiles are based on CDC (Girls, 2-20 Years) data.  Exam General: Alert and oriented x 3, NAD Cardiovascular: S1 S2 auscultated,  RRR Respiratory: Clear to auscultation bilaterally, no wheezing Gastrointestinal: Soft, RUQ TTP, NBS Ext: no pedal edema bilaterally Neuro: no new deficits Psych: Normal affect and demeanor, alert and oriented x3     Data Reviewed:  I have personally reviewed following labs    CBC Lab Results  Component Value Date   WBC 5.2 03/03/2022   RBC 4.02 03/03/2022   HGB 9.3 (L) 03/03/2022   HCT 34.0 (L) 03/03/2022   MCV 84.6 03/03/2022   MCH 23.1 (L) 03/03/2022   PLT 220 03/03/2022   MCHC 27.4 (L) 03/03/2022   RDW 16.2 (H) 03/03/2022   LYMPHSABS 1.1 03/02/2022   MONOABS 0.3 03/02/2022   EOSABS 0.1 03/02/2022   BASOSABS 0.0 0000000     Last metabolic panel Lab Results  Component Value Date   NA 138 03/03/2022   K 3.7 03/03/2022   CL 110 03/03/2022   CO2 23 03/03/2022   BUN 6 03/03/2022   CREATININE 0.62 03/03/2022   GLUCOSE 91 03/03/2022    GFRNONAA >60 03/03/2022   CALCIUM 8.4 (L) 03/03/2022   PROT 6.8 03/03/2022   ALBUMIN 3.5 03/03/2022   BILITOT 3.4 (H) 03/03/2022   ALKPHOS 173 (H) 03/03/2022   AST 369 (H) 03/03/2022   ALT 382 (H) 03/03/2022   ANIONGAP 5 03/03/2022    CBG (last 3)  No results for input(s): "GLUCAP" in the last 72 hours.    Coagulation Profile: No results for input(s): "INR", "PROTIME" in the last 168 hours.   Radiology Studies: I have personally reviewed the imaging studies  US Abdomen Limited RUQ (LIVER/GB)  Result Date: 03/02/2022 CLINICAL DATA:  Two views history of right upper quadrant pain EXAM: ULTRASOUND ABDOMEN LIMITED RIGHT UPPER QUADRANT COMPARISON:  None Available. FINDINGS: Gallbladder: There are a few layering gallstones the largest which measures up to 6 mm. No wall thickening visualized. No sonographic Murphy sign noted by sonographer. Common bile duct: Diameter: 5 mm, normal Liver: Limitations: The left hepatic lobe is not visualized and could not be assessed to body habitus. The right hepatic lobe poorly visualized and incompletely assessed for the presence focal hepatic lesions. Portal vein is patent on color Doppler imaging with normal direction of blood flow towards the liver. Other: None. IMPRESSION: Cholelithiasis without sonographic finding to suggest acute cholecystitis. Electronically Signed   By: Marin Roberts M.D.   On: 03/02/2022 18:02       Everlie Eble M.D. [facility] 03/03/2022, 10:33 AM  Available via Epic secure chat 7am-7pm After 7 pm, please refer to night coverage provider listed on amion.

## 2022-03-03 NOTE — Consult Note (Signed)
[facility] Gastroenterology Consult  Referring Provider: ER Primary Care Physician:  Duard Larsen, MD Primary Gastroenterologist: Althia Forts  Reason for Consultation: Abnormal LFTs  HPI: Jessica Solomon is a 22 y.o. female is 4 months postpartum, currently not breast-feeding, presented to the ER with complaints of upper abdominal pain associated with nausea and vomiting but without fever.  Initially patient thought she had episode of acid reflux and tried using Gas-X pills and she decided to come to the ED.  In the ER she was found to have, elevated LFTs, T. bili 3.5/AST 675/ALT 539/ALP 204 and underwent an ultrasound which showed cholelithiasis without acute cholecystitis with a CBD of 5 mm. Patient denies prior GI workup. No history of prior endoscopy or colonoscopy. She denies change in bowel habits, noticing blood in stool, black stools, difficulty swallowing, pain on swallowing, unintentional weight loss or loss of appetite.   Past Medical History:  Diagnosis Date   Asthma     Past Surgical History:  Procedure Laterality Date   TONSILLECTOMY      Prior to Admission medications   Medication Sig Start Date End Date Taking? Authorizing Provider  acetaminophen (TYLENOL) 325 MG tablet Take 650 mg by mouth 2 (two) times daily as needed for headache.    [provider]  albuterol (VENTOLIN HFA) 108 (90 Base) MCG/ACT inhaler Inhale 2 puffs into the lungs every 6 (six) hours as needed for wheezing or shortness of breath. Patient taking differently: Inhale 2 puffs into the lungs 2 (two) times daily as needed for wheezing or shortness of breath. 05/01/21   Renard Matter, MD  amLODipine (NORVASC) 5 MG tablet Take 1 tablet (5 mg total) by mouth daily. 10/23/21   Deloria Lair, DO  famotidine (PEPCID) 20 MG tablet Take 1 tablet (20 mg total) by mouth 2 (two) times daily at 8 am and 10 pm. Patient not taking: Reported on 10/15/2021 08/31/21   Jorje Guild, NP  ferrous gluconate (FERGON)  324 MG tablet Take 1 tablet (324 mg total) by mouth daily with breakfast. 09/16/21   Gabriel Carina, CNM  furosemide (LASIX) 20 MG tablet Take 1 tablet (20 mg total) by mouth daily. 10/22/21   Deloria Lair, DO  glycopyrrolate (ROBINUL) 1 MG tablet Take 1 tablet (1 mg total) by mouth 3 (three) times daily. Patient not taking: Reported on 10/15/2021 06/25/21   Gabriel Carina, CNM  ibuprofen (ADVIL) 600 MG tablet Take 1 tablet (600 mg total) by mouth every 6 (six) hours. 10/22/21   Deloria Lair, DO  Prenatal Vit-Fe Fumarate-FA (MULTIVITAMIN-PRENATAL) 27-0.8 MG TABS tablet Take 1 tablet by mouth daily at 12 noon.    [provider]    Current Facility-Administered Medications  Medication Dose Route Frequency Provider Last Rate Last Admin   0.9 %  sodium chloride infusion   Intravenous Continuous Hugelmeyer, Alexis, DO 125 mL/hr at 03/02/22 2228 New Bag at 03/02/22 2228   bisacodyl (DULCOLAX) EC tablet 5 mg  5 mg Oral Daily PRN Hugelmeyer, Alexis, DO       hydrALAZINE (APRESOLINE) injection 5 mg  5 mg Intravenous Q6H PRN Hugelmeyer, Alexis, DO       ibuprofen (ADVIL) tablet 400 mg  400 mg Oral Q6H PRN Hugelmeyer, Alexis, DO       morphine (PF) 2 MG/ML injection 1 mg  1 mg Intravenous Q6H PRN Hugelmeyer, Alexis, DO       ondansetron (ZOFRAN) tablet 4 mg  4 mg Oral Q6H PRN Hugelmeyer, Alexis, DO  Or   ondansetron (ZOFRAN) injection 4 mg  4 mg Intravenous Q6H PRN Hugelmeyer, Alexis, DO       oxyCODONE (Oxy IR/ROXICODONE) immediate release tablet 5 mg  5 mg Oral Q4H PRN Hugelmeyer, Alexis, DO       senna-docusate (Senokot-S) tablet 1 tablet  1 tablet Oral QHS PRN Hugelmeyer, Alexis, DO       sodium chloride flush (NS) 0.9 % injection 3 mL  3 mL Intravenous Q12H Hugelmeyer, Alexis, DO   3 mL at 03/03/22 0806   traZODone (DESYREL) tablet 25 mg  25 mg Oral QHS PRN Hugelmeyer, Alexis, DO        Allergies as of 03/02/2022 - Review Complete 03/02/2022  Allergen Reaction Noted    Flonase [fluticasone propionate] Other (See Comments) 09/10/2012    Family History  Problem Relation Age of Onset   Asthma Neg Hx    Cancer Neg Hx    Diabetes Neg Hx    Hyperlipidemia Neg Hx    Hypertension Neg Hx     Social History   Socioeconomic History   Marital status: Single    Spouse name: Not on file   Number of children: Not on file   Years of education: Not on file   Highest education level: Not on file  Occupational History   Not on file  Tobacco Use   Smoking status: Never   Smokeless tobacco: Never  Vaping Use   Vaping Use: Former   Substances: Nicotine, Flavoring  Substance and Sexual Activity   Alcohol use: Never   Drug use: Never   Sexual activity: Yes    Birth control/protection: None  Other Topics Concern   Not on file  Social History Narrative   Not on file   Social Determinants of Health   Financial Resource Strain: Not on file  Food Insecurity: Food Insecurity Present (10/22/2021)   Hunger Vital Sign    Worried About Running Out of Food in the Last Year: Sometimes true    Ran Out of Food in the Last Year: Sometimes true  Transportation Needs: No Transportation Needs (10/22/2021)   PRAPARE - Hydrologist (Medical): No    Lack of Transportation (Non-Medical): No  Physical Activity: Not on file  Stress: Not on file  Social Connections: Not on file  Intimate Partner Violence: Not At Risk (10/22/2021)   Humiliation, Afraid, Rape, and Kick questionnaire    Fear of Current or Ex-Partner: No    Emotionally Abused: No    Physically Abused: No    Sexually Abused: No    Review of Systems: As per HPI  Physical Exam: Vital signs in last 24 hours: Temp:  [97.6 F (36.4 C)-98.6 F (37 C)] 97.6 F (36.4 C) (02/27 0518) Pulse Rate:  [51-82] 51 (02/27 0518) Resp:  [16-18] 16 (02/27 0518) BP: (106-131)/(45-78) 111/58 (02/27 0518) SpO2:  [98 %-100 %] 100 % (02/27 0518) Weight:  [121.1 kg] 121.1 kg (02/26 1649) Last  BM Date : 03/02/22  General:   Alert,  Well-developed, overweight, pleasant and cooperative in NAD Head:  Normocephalic and atraumatic. Eyes:  Sclera clear, no icterus.   Conjunctiva pink. Ears:  Normal auditory acuity. Nose:  No deformity, discharge,  or lesions. Mouth:  No deformity or lesions.  Oropharynx pink & moist. Neck:  Supple; no masses or thyromegaly. Lungs:  Clear throughout to auscultation.   No wheezes, crackles, or rhonchi. No acute distress. Heart:  Regular rate and rhythm; no murmurs, clicks,  rubs,  or gallops. Extremities:  Without clubbing or edema. Neurologic:  Alert and  oriented x4;  grossly normal neurologically. Skin:  Intact without significant lesions or rashes. Psych:  Alert and cooperative. Normal mood and affect. Abdomen:  Soft, mild upper abdominal tenderness and nondistended. No masses, hepatosplenomegaly or hernias noted. Normal bowel sounds, without guarding, and without rebound.         Lab Results: Recent Labs    03/02/22 1720 03/03/22 0449  WBC 5.9 5.2  HGB 9.9* 9.3*  HCT 32.0* 34.0*  PLT 291 220   BMET Recent Labs    03/02/22 1720 03/03/22 0449  NA 136 138  K 3.2* 3.7  CL 103 110  CO2 24 23  GLUCOSE 110* 91  BUN 7 6  CREATININE 0.58 0.62  CALCIUM 9.6 8.4*   LFT Recent Labs    03/03/22 0449  PROT 6.8  ALBUMIN 3.5  AST 369*  ALT 382*  ALKPHOS 173*  BILITOT 3.4*   PT/INR No results for input(s): "LABPROT", "INR" in the last 72 hours.  Studies/Results: US Abdomen Limited RUQ (LIVER/GB)  Result Date: 03/02/2022 CLINICAL DATA:  Two views history of right upper quadrant pain EXAM: ULTRASOUND ABDOMEN LIMITED RIGHT UPPER QUADRANT COMPARISON:  None Available. FINDINGS: Gallbladder: There are a few layering gallstones the largest which measures up to 6 mm. No wall thickening visualized. No sonographic Murphy sign noted by sonographer. Common bile duct: Diameter: 5 mm, normal Liver: Limitations: The left hepatic lobe is not  visualized and could not be assessed to body habitus. The right hepatic lobe poorly visualized and incompletely assessed for the presence focal hepatic lesions. Portal vein is patent on color Doppler imaging with normal direction of blood flow towards the liver. Other: None. IMPRESSION: Cholelithiasis without sonographic finding to suggest acute cholecystitis. Electronically Signed   By: Marin Roberts M.D.   On: 03/02/2022 18:02    Impression: Cholelithiasis Abnormal LFTs, T. bili/AST/ALT/ALP of 3.4/369/22/173 today Normal at 5 mm on ultrasound  Microcytic anemia, hemoglobin/hct 9.3/84.6 now was 9.9/73.9 yesterday  Plan: MRCP today, if CBD stone noted will need ERCP(the risks, benefits, alternatives were discussed with the patient and her mother at bedside). If CBD is unremarkable, recommend surgical evaluation and cholecystectomy.   LOS: 1 day   Ronnette Juniper, MD  03/03/2022, 9:43 AM

## 2022-03-04 ENCOUNTER — Other Ambulatory Visit: Payer: Self-pay

## 2022-03-04 ENCOUNTER — Inpatient Hospital Stay (HOSPITAL_COMMUNITY): Payer: Self-pay | Admitting: Anesthesiology

## 2022-03-04 ENCOUNTER — Encounter (HOSPITAL_COMMUNITY): Payer: Self-pay | Admitting: Family Medicine

## 2022-03-04 ENCOUNTER — Encounter (HOSPITAL_COMMUNITY)
Admission: EM | Disposition: A | Discharge status: Home / Self Care | Payer: Self-pay | Source: Home / Self Care | Attending: Internal Medicine

## 2022-03-04 DIAGNOSIS — K8020 Calculus of gallbladder without cholecystitis without obstruction: Secondary | ICD-10-CM

## 2022-03-04 DIAGNOSIS — Z6841 Body Mass Index (BMI) 40.0 and over, adult: Secondary | ICD-10-CM

## 2022-03-04 DIAGNOSIS — J45909 Unspecified asthma, uncomplicated: Secondary | ICD-10-CM

## 2022-03-04 HISTORY — PX: CHOLECYSTECTOMY: SHX55

## 2022-03-04 LAB — COMPREHENSIVE METABOLIC PANEL
ALT: 259 U/L — ABNORMAL HIGH (ref 0–44)
AST: 110 U/L — ABNORMAL HIGH (ref 15–41)
Albumin: 3.3 g/dL — ABNORMAL LOW (ref 3.5–5.0)
Alkaline Phosphatase: 145 U/L — ABNORMAL HIGH (ref 38–126)
Anion gap: 5 (ref 5–15)
BUN: 6 mg/dL (ref 6–20)
CO2: 23 mmol/L (ref 22–32)
Calcium: 8.4 mg/dL — ABNORMAL LOW (ref 8.9–10.3)
Chloride: 108 mmol/L (ref 98–111)
Creatinine, Ser: 0.59 mg/dL (ref 0.44–1.00)
GFR, Estimated: >60 mL/min (ref >60)
Glucose, Bld: 82 mg/dL (ref 70–99)
Potassium: 3.9 mmol/L (ref 3.5–5.1)
Sodium: 136 mmol/L (ref 135–145)
Total Bilirubin: 1.8 mg/dL — ABNORMAL HIGH (ref 0.3–1.2)
Total Protein: 6.4 g/dL — ABNORMAL LOW (ref 6.5–8.1)

## 2022-03-04 LAB — CBC
HCT: 30.8 % — ABNORMAL LOW (ref 36.0–46.0)
Hemoglobin: 9.4 g/dL — ABNORMAL LOW (ref 12.0–15.0)
MCH: 23.1 pg — ABNORMAL LOW (ref 26.0–34.0)
MCHC: 30.5 g/dL (ref 30.0–36.0)
MCV: 75.7 fL — ABNORMAL LOW (ref 80.0–100.0)
Platelets: 243 10*3/uL (ref 150–400)
RBC: 4.07 MIL/uL (ref 3.87–5.11)
RDW: 16.3 % — ABNORMAL HIGH (ref 11.5–15.5)
WBC: 6.6 10*3/uL (ref 4.0–10.5)
nRBC: 0 % (ref 0.0–0.2)

## 2022-03-04 SURGERY — LAPAROSCOPIC CHOLECYSTECTOMY
Anesthesia: General

## 2022-03-04 MED ORDER — ONDANSETRON HCL 4 MG/2ML IJ SOLN
INTRAMUSCULAR | Status: AC
  Filled 2022-03-04: qty 2

## 2022-03-04 MED ORDER — CHLORHEXIDINE GLUCONATE 0.12 % MT SOLN
15.0000 mL | Freq: Once | OROMUCOSAL | Status: AC
  Administered 2022-03-04: 15 mL via OROMUCOSAL

## 2022-03-04 MED ORDER — ACETAMINOPHEN 500 MG PO TABS
1000.0000 mg | ORAL_TABLET | Freq: Four times a day (QID) | ORAL | Status: DC
  Administered 2022-03-04 – 2022-03-05 (×4): 1000 mg via ORAL
  Filled 2022-03-04 (×4): qty 2

## 2022-03-04 MED ORDER — DEXAMETHASONE SODIUM PHOSPHATE 10 MG/ML IJ SOLN
INTRAMUSCULAR | Status: AC
  Filled 2022-03-04: qty 1

## 2022-03-04 MED ORDER — POLYETHYLENE GLYCOL 3350 17 G PO PACK: 17.0000 g | PACK | Freq: Every day | ORAL | Status: DC | PRN

## 2022-03-04 MED ORDER — SUGAMMADEX SODIUM 200 MG/2ML IV SOLN
INTRAVENOUS | Status: DC | PRN
  Administered 2022-03-04: 300 mg via INTRAVENOUS

## 2022-03-04 MED ORDER — PROPOFOL 10 MG/ML IV BOLUS
INTRAVENOUS | Status: DC | PRN
  Administered 2022-03-04: 100 mg via INTRAVENOUS
  Administered 2022-03-04: 200 mg via INTRAVENOUS

## 2022-03-04 MED ORDER — ROCURONIUM BROMIDE 10 MG/ML (PF) SYRINGE
PREFILLED_SYRINGE | INTRAVENOUS | Status: AC
  Filled 2022-03-04: qty 10

## 2022-03-04 MED ORDER — LACTATED RINGERS IV SOLN: INTRAVENOUS | Status: DC | PRN

## 2022-03-04 MED ORDER — PHENYLEPHRINE 80 MCG/ML (10ML) SYRINGE FOR IV PUSH (FOR BLOOD PRESSURE SUPPORT)
PREFILLED_SYRINGE | INTRAVENOUS | Status: DC | PRN
  Administered 2022-03-04 (×2): 120 ug via INTRAVENOUS

## 2022-03-04 MED ORDER — HYDROMORPHONE HCL 1 MG/ML IJ SOLN
INTRAMUSCULAR | Status: AC
  Filled 2022-03-04: qty 1

## 2022-03-04 MED ORDER — LACTATED RINGERS IV SOLN: Freq: Once | INTRAVENOUS | Status: AC

## 2022-03-04 MED ORDER — DOCUSATE SODIUM 100 MG PO CAPS
100.0000 mg | ORAL_CAPSULE | Freq: Two times a day (BID) | ORAL | Status: DC
  Administered 2022-03-04 – 2022-03-05 (×2): 100 mg via ORAL
  Filled 2022-03-04 (×2): qty 1

## 2022-03-04 MED ORDER — PHENYLEPHRINE 80 MCG/ML (10ML) SYRINGE FOR IV PUSH (FOR BLOOD PRESSURE SUPPORT)
PREFILLED_SYRINGE | INTRAVENOUS | Status: AC
  Filled 2022-03-04: qty 10

## 2022-03-04 MED ORDER — LACTATED RINGERS IR SOLN
Status: DC | PRN
  Administered 2022-03-04: 1000 mL

## 2022-03-04 MED ORDER — FENTANYL CITRATE (PF) 100 MCG/2ML IJ SOLN
INTRAMUSCULAR | Status: DC | PRN
  Administered 2022-03-04: 50 ug via INTRAVENOUS
  Administered 2022-03-04 (×2): 100 ug via INTRAVENOUS

## 2022-03-04 MED ORDER — LIDOCAINE HCL (PF) 2 % IJ SOLN
INTRAMUSCULAR | Status: AC
  Filled 2022-03-04: qty 5

## 2022-03-04 MED ORDER — IBUPROFEN 400 MG PO TABS
600.0000 mg | ORAL_TABLET | Freq: Four times a day (QID) | ORAL | Status: DC
  Administered 2022-03-04 – 2022-03-05 (×3): 600 mg via ORAL
  Filled 2022-03-04 (×4): qty 1

## 2022-03-04 MED ORDER — MIDAZOLAM HCL 2 MG/2ML IJ SOLN
INTRAMUSCULAR | Status: AC
  Filled 2022-03-04: qty 2

## 2022-03-04 MED ORDER — HEMOSTATIC AGENTS (NO CHARGE) OPTIME
TOPICAL | Status: DC | PRN
  Administered 2022-03-04: 1

## 2022-03-04 MED ORDER — MIDAZOLAM HCL 5 MG/5ML IJ SOLN
INTRAMUSCULAR | Status: DC | PRN
  Administered 2022-03-04: 2 mg via INTRAVENOUS

## 2022-03-04 MED ORDER — FENTANYL CITRATE (PF) 250 MCG/5ML IJ SOLN
INTRAMUSCULAR | Status: AC
  Filled 2022-03-04: qty 5

## 2022-03-04 MED ORDER — SUCCINYLCHOLINE CHLORIDE 200 MG/10ML IV SOSY
PREFILLED_SYRINGE | INTRAVENOUS | Status: DC | PRN
  Administered 2022-03-04: 120 mg via INTRAVENOUS

## 2022-03-04 MED ORDER — BUPIVACAINE-EPINEPHRINE 0.25% -1:200000 IJ SOLN
INTRAMUSCULAR | Status: DC | PRN
  Administered 2022-03-04: 20 mL

## 2022-03-04 MED ORDER — KETOROLAC TROMETHAMINE 30 MG/ML IJ SOLN
INTRAMUSCULAR | Status: AC
  Filled 2022-03-04: qty 1

## 2022-03-04 MED ORDER — HYDROMORPHONE HCL 1 MG/ML IJ SOLN
0.2500 mg | INTRAMUSCULAR | Status: DC | PRN
  Administered 2022-03-04: 0.5 mg via INTRAVENOUS

## 2022-03-04 MED ORDER — ONDANSETRON HCL 4 MG/2ML IJ SOLN
INTRAMUSCULAR | Status: DC | PRN
  Administered 2022-03-04: 4 mg via INTRAVENOUS

## 2022-03-04 MED ORDER — ROCURONIUM BROMIDE 10 MG/ML (PF) SYRINGE
PREFILLED_SYRINGE | INTRAVENOUS | Status: DC | PRN
  Administered 2022-03-04: 20 mg via INTRAVENOUS
  Administered 2022-03-04: 60 mg via INTRAVENOUS

## 2022-03-04 MED ORDER — 0.9 % SODIUM CHLORIDE (POUR BTL) OPTIME
TOPICAL | Status: DC | PRN
  Administered 2022-03-04: 1000 mL

## 2022-03-04 MED ORDER — BUPIVACAINE-EPINEPHRINE (PF) 0.25% -1:200000 IJ SOLN
INTRAMUSCULAR | Status: AC
  Filled 2022-03-04: qty 30

## 2022-03-04 MED ORDER — LIDOCAINE 2% (20 MG/ML) 5 ML SYRINGE
INTRAMUSCULAR | Status: DC | PRN
  Administered 2022-03-04: 60 mg via INTRAVENOUS

## 2022-03-04 MED ORDER — KETOROLAC TROMETHAMINE 30 MG/ML IJ SOLN
INTRAMUSCULAR | Status: DC | PRN
  Administered 2022-03-04: 30 mg via INTRAVENOUS

## 2022-03-04 MED ORDER — DEXAMETHASONE SODIUM PHOSPHATE 4 MG/ML IJ SOLN
INTRAMUSCULAR | Status: DC | PRN
  Administered 2022-03-04: 5 mg via INTRAVENOUS

## 2022-03-04 SURGICAL SUPPLY — 54 items
ADH SKN CLS APL DERMABOND .7 (GAUZE/BANDAGES/DRESSINGS) ×1
APL PRP STRL LF DISP 70% ISPRP (MISCELLANEOUS) ×1
APL SRG 38 LTWT LNG FL B (MISCELLANEOUS) ×1
APPLICATOR ARISTA FLEXITIP XL (MISCELLANEOUS) IMPLANT
APPLIER CLIP 5 13 M/L LIGAMAX5 (MISCELLANEOUS) ×1
APR CLP MED LRG 5 ANG JAW (MISCELLANEOUS) ×1
BAG COUNTER SPONGE SURGICOUNT (BAG) IMPLANT
BAG SPEC RTRVL 10 TROC 200 (ENDOMECHANICALS)
BAG SPNG CNTER NS LX DISP (BAG)
CHLORAPREP W/TINT 26 (MISCELLANEOUS) ×1 IMPLANT
CLIP APPLIE 5 13 M/L LIGAMAX5 (MISCELLANEOUS) ×1 IMPLANT
COVER MAYO STAND XLG (MISCELLANEOUS) ×1 IMPLANT
COVER SURGICAL LIGHT HANDLE (MISCELLANEOUS) ×1 IMPLANT
DERMABOND ADVANCED .7 DNX12 (GAUZE/BANDAGES/DRESSINGS) ×1 IMPLANT
DRAPE C-ARM 42X120 X-RAY (DRAPES) IMPLANT
ELECT L-HOOK LAP 45CM DISP (ELECTROSURGICAL)
ELECT PENCIL ROCKER SW 15FT (MISCELLANEOUS) ×1 IMPLANT
ELECT REM PT RETURN 15FT ADLT (MISCELLANEOUS) ×1 IMPLANT
ELECTRODE L-HOOK LAP 45CM DISP (ELECTROSURGICAL) IMPLANT
ENDOLOOP SUT PDS II  0 18 (SUTURE) ×1
ENDOLOOP SUT PDS II 0 18 (SUTURE) IMPLANT
GLOVE BIOGEL PI IND STRL 6 (GLOVE) ×1 IMPLANT
GLOVE BIOGEL PI MICRO STRL 5.5 (GLOVE) ×1 IMPLANT
GOWN STRL REUS W/ TWL LRG LVL3 (GOWN DISPOSABLE) ×1 IMPLANT
GOWN STRL REUS W/TWL LRG LVL3 (GOWN DISPOSABLE) ×1
GRASPER SUT TROCAR 14GX15 (MISCELLANEOUS) IMPLANT
HEMOSTAT ARISTA ABSORB 3G PWDR (HEMOSTASIS) IMPLANT
HEMOSTAT SNOW SURGICEL 2X4 (HEMOSTASIS) IMPLANT
IRRIG SUCT STRYKERFLOW 2 WTIP (MISCELLANEOUS) ×1
IRRIGATION SUCT STRKRFLW 2 WTP (MISCELLANEOUS) ×1 IMPLANT
KIT BASIN OR (CUSTOM PROCEDURE TRAY) ×1 IMPLANT
KIT TURNOVER KIT A (KITS) IMPLANT
L-HOOK LAP DISP 36CM (ELECTROSURGICAL) ×1
LHOOK LAP DISP 36CM (ELECTROSURGICAL) ×1 IMPLANT
MAT PREVALON FULL STRYKER (MISCELLANEOUS) IMPLANT
NDL INSUFFLATION 14GA 120MM (NEEDLE) IMPLANT
NEEDLE INSUFFLATION 14GA 120MM (NEEDLE) IMPLANT
POUCH RETRIEVAL ECOSAC 10 (ENDOMECHANICALS) IMPLANT
POUCH RETRIEVAL ECOSAC 10MM (ENDOMECHANICALS)
SCISSORS LAP 5X35 DISP (ENDOMECHANICALS) ×1 IMPLANT
SET CHOLANGIOGRAPH MIX (MISCELLANEOUS) IMPLANT
SET TUBE SMOKE EVAC HIGH FLOW (TUBING) ×1 IMPLANT
SLEEVE Z-THREAD 5X100MM (TROCAR) ×2 IMPLANT
SPIKE FLUID TRANSFER (MISCELLANEOUS) ×1 IMPLANT
SUT MNCRL AB 4-0 PS2 18 (SUTURE) ×1 IMPLANT
SYS BAG RETRIEVAL 10MM (BASKET) ×1
SYSTEM BAG RETRIEVAL 10MM (BASKET) IMPLANT
TOWEL OR 17X26 10 PK STRL BLUE (TOWEL DISPOSABLE) ×1 IMPLANT
TOWEL OR NON WOVEN STRL DISP B (DISPOSABLE) IMPLANT
TRAY LAPAROSCOPIC (CUSTOM PROCEDURE TRAY) ×1 IMPLANT
TROCAR 12M 150ML BLUNT (TROCAR) IMPLANT
TROCAR ADV FIXATION 12X100MM (TROCAR) IMPLANT
TROCAR BALLN 12MMX100 BLUNT (TROCAR) IMPLANT
TROCAR Z-THREAD OPTICAL 5X100M (TROCAR) ×1 IMPLANT

## 2022-03-04 NOTE — Progress Notes (Signed)
Day of Surgery  Subjective: MRCP did not show any choledocholithiasis. LFTs downtrending this morning. Denies pain today.   Objective: Vital signs in last 24 hours: Temp:  [97.5 F (36.4 C)-98.9 F (37.2 C)] 98.5 F (36.9 C) (02/28 0723) Pulse Rate:  [52-61] 61 (02/28 0723) Resp:  [18] 18 (02/28 0723) BP: (112-135)/(52-73) 127/56 (02/28 0723) SpO2:  [98 %-100 %] 98 % (02/28 0723) Weight:  [121.1 kg] 121.1 kg (02/28 0737) Last BM Date : 03/02/22  Intake/Output from previous day: 02/27 0701 - 02/28 0700 In: 2001.8 [P.O.:180; I.V.:1821.8] Out: 0  Intake/Output this shift: No intake/output data recorded.  PE: General: resting comfortably, NAD Neuro: alert and oriented, no focal deficits Resp: normal work of breathing Abdomen: soft, nondistended, nontender to palpation. Extremities: warm and well-perfused   Lab Results:  Recent Labs    03/03/22 0449 03/04/22 0609  WBC 5.2 6.6  HGB 9.3* 9.4*  HCT 34.0* 30.8*  PLT 220 243   BMET Recent Labs    03/03/22 0449 03/04/22 0609  NA 138 136  K 3.7 3.9  CL 110 108  CO2 23 23  GLUCOSE 91 82  BUN 6 6  CREATININE 0.62 0.59  CALCIUM 8.4* 8.4*   PT/INR No results for input(s): "LABPROT", "INR" in the last 72 hours. CMP     Component Value Date/Time   NA 136 03/04/2022 0609   K 3.9 03/04/2022 0609   CL 108 03/04/2022 0609   CO2 23 03/04/2022 0609   GLUCOSE 82 03/04/2022 0609   BUN 6 03/04/2022 0609   CREATININE 0.59 03/04/2022 0609   CALCIUM 8.4 (L) 03/04/2022 0609   PROT 6.4 (L) 03/04/2022 0609   ALBUMIN 3.3 (L) 03/04/2022 0609   AST 110 (H) 03/04/2022 0609   ALT 259 (H) 03/04/2022 0609   ALKPHOS 145 (H) 03/04/2022 0609   BILITOT 1.8 (H) 03/04/2022 0609   GFRNONAA >60 03/04/2022 0609   Lipase     Component Value Date/Time   LIPASE 46 03/02/2022 1720       Studies/Results: MR ABDOMEN MRCP W WO CONTAST  Result Date: 03/03/2022 CLINICAL DATA:  Hepatitis, abdominal pain, nausea and vomiting for 2  weeks ,elevated LFTs, cholelithiasis EXAM: MRI ABDOMEN WITHOUT AND WITH CONTRAST (INCLUDING MRCP) TECHNIQUE: Multiplanar multisequence MR imaging of the abdomen was performed both before and after the administration of intravenous contrast. Heavily T2-weighted images of the biliary and pancreatic ducts were obtained, and three-dimensional MRCP images were rendered by post processing. CONTRAST:  59m GADAVIST GADOBUTROL 1 MMOL/ML IV SOLN COMPARISON:  Abdominal ultrasound, 03/02/2022 FINDINGS: Lower chest: No acute abnormality. Hepatobiliary: No solid liver abnormality is seen. Tiny gallstones in the gallbladder fundus (series 4, image 12). No gallbladder wall thickening, or biliary dilatation. Pancreas: Unremarkable. No pancreatic ductal dilatation or surrounding inflammatory changes. Spleen: Normal in size without significant abnormality. Adrenals/Urinary Tract: Adrenal glands are unremarkable. Kidneys are normal, without renal calculi, solid lesion, or hydronephrosis. Stomach/Bowel: Stomach is within normal limits. No evidence of bowel wall thickening, distention, or inflammatory changes. Vascular/Lymphatic: No significant vascular findings are present. No enlarged abdominal lymph nodes. Other: No abdominal wall hernia or abnormality. No ascites. Musculoskeletal: No acute or significant osseous findings. IMPRESSION: 1. Tiny gallstones. No gallbladder wall thickening or biliary ductal dilatation. 2. No acute MR findings. Electronically Signed   By: ADelanna AhmadiM.D.   On: 03/03/2022 14:58   MR 3D Recon At Scanner  Result Date: 03/03/2022 CLINICAL DATA:  Hepatitis, abdominal pain, nausea and vomiting for 2  weeks ,elevated LFTs, cholelithiasis EXAM: MRI ABDOMEN WITHOUT AND WITH CONTRAST (INCLUDING MRCP) TECHNIQUE: Multiplanar multisequence MR imaging of the abdomen was performed both before and after the administration of intravenous contrast. Heavily T2-weighted images of the biliary and pancreatic ducts were  obtained, and three-dimensional MRCP images were rendered by post processing. CONTRAST:  77m GADAVIST GADOBUTROL 1 MMOL/ML IV SOLN COMPARISON:  Abdominal ultrasound, 03/02/2022 FINDINGS: Lower chest: No acute abnormality. Hepatobiliary: No solid liver abnormality is seen. Tiny gallstones in the gallbladder fundus (series 4, image 12). No gallbladder wall thickening, or biliary dilatation. Pancreas: Unremarkable. No pancreatic ductal dilatation or surrounding inflammatory changes. Spleen: Normal in size without significant abnormality. Adrenals/Urinary Tract: Adrenal glands are unremarkable. Kidneys are normal, without renal calculi, solid lesion, or hydronephrosis. Stomach/Bowel: Stomach is within normal limits. No evidence of bowel wall thickening, distention, or inflammatory changes. Vascular/Lymphatic: No significant vascular findings are present. No enlarged abdominal lymph nodes. Other: No abdominal wall hernia or abnormality. No ascites. Musculoskeletal: No acute or significant osseous findings. IMPRESSION: 1. Tiny gallstones. No gallbladder wall thickening or biliary ductal dilatation. 2. No acute MR findings. Electronically Signed   By: ADelanna AhmadiM.D.   On: 03/03/2022 14:58   UKoreaAbdomen Limited RUQ (LIVER/GB)  Result Date: 03/02/2022 CLINICAL DATA:  Two views history of right upper quadrant pain EXAM: ULTRASOUND ABDOMEN LIMITED RIGHT UPPER QUADRANT COMPARISON:  None Available. FINDINGS: Gallbladder: There are a few layering gallstones the largest which measures up to 6 mm. No wall thickening visualized. No sonographic Murphy sign noted by sonographer. Common bile duct: Diameter: 5 mm, normal Liver: Limitations: The left hepatic lobe is not visualized and could not be assessed to body habitus. The right hepatic lobe poorly visualized and incompletely assessed for the presence focal hepatic lesions. Portal vein is patent on color Doppler imaging with normal direction of blood flow towards the liver.  Other: None. IMPRESSION: Cholelithiasis without sonographic finding to suggest acute cholecystitis. Electronically Signed   By: HMarin RobertsM.D.   On: 03/02/2022 18:02    Anti-infectives: Anti-infectives (From admission, onward)    Start     Dose/Rate Route Frequency Ordered Stop   03/04/22 0600  cefTRIAXone (ROCEPHIN) 2 g in sodium chloride 0.9 % 100 mL IVPB        2 g 200 mL/hr over 30 Minutes Intravenous On call to O.R. 03/03/22 1505 03/05/22 0559        Assessment/Plan 22yo female presenting with elevated LFTs and cholelithiasis. MRCP negative for choledocholithiasis. She has presumably passed a stone via the common bile duct. Proceed to OR for laparoscopic cholecystectomy. I reviewed the details of this procedure with the patient, including the benefits and <0.5% risk of common bile duct injury. She expressed understanding and agrees to proceed with surgery. I also reviewed the postoperative instructions and activity restrictions.    LOS: 2 days    SMichaelle Birks MD [facility]ex[facility], Hepatobiliary and Pancreatic Surgery 03/04/22 7:58 AM

## 2022-03-04 NOTE — Anesthesia Postprocedure Evaluation (Signed)
Anesthesia Post Note  Patient: Jessica Solomon  Procedure(s) Performed: LAPAROSCOPIC CHOLECYSTECTOMY     Patient location during evaluation: PACU Anesthesia Type: General Level of consciousness: awake and alert Pain management: pain level controlled Vital Signs Assessment: post-procedure vital signs reviewed and stable Respiratory status: spontaneous breathing, nonlabored ventilation and respiratory function stable Cardiovascular status: blood pressure returned to baseline and stable Postop Assessment: no apparent nausea or vomiting Anesthetic complications: no  No notable events documented.  Last Vitals:  Vitals:   03/04/22 1000 03/04/22 1034  BP: 127/61 131/75  Pulse: 68 76  Resp: 19 17  Temp:  37.2 C  SpO2: 100% 100%    Last Pain:  Vitals:   03/04/22 1055  TempSrc:   PainSc: 8                  Elsie Baynes,W. EDMOND

## 2022-03-04 NOTE — Transfer of Care (Signed)
Immediate Anesthesia Transfer of Care Note  Patient: Jessica Solomon  Procedure(s) Performed: LAPAROSCOPIC CHOLECYSTECTOMY  Patient Location: PACU  Anesthesia Type:General  Level of Consciousness: awake and patient cooperative  Airway & Oxygen Therapy: Patient Spontanous Breathing and Patient connected to face mask  Post-op Assessment: Report given to RN and Post -op Vital signs reviewed and stable  Post vital signs: Reviewed and stable  Last Vitals:  Vitals Value Taken Time  BP 121/67 03/04/22 0936  Temp    Pulse 92 03/04/22 0938  Resp 26 03/04/22 0938  SpO2 100 % 03/04/22 0938  Vitals shown include unvalidated device data.  Last Pain:  Vitals:   03/04/22 0737  TempSrc:   PainSc: 0-No pain      Patients Stated Pain Goal: 2 ([id number])  Complications: No notable events documented.

## 2022-03-04 NOTE — Op Note (Signed)
Date: 03/04/22  Patient: Jessica Solomon MRN: [id number]  Preoperative Diagnosis: Symptomatic cholelithiasis Postoperative Diagnosis: Same  Procedure: Laparoscopic   Surgeon: Michaelle Birks, MD  EBL: Minimal  Anesthesia: General endotracheal  Specimens: Gallbladder  Indications: [name] is a 22 yo female who presented with abdominal pain and elevated LFTs, and was found to have cholelithiasis. MRCP did not show signs of choledocholithiasis. LFTs are downtrending this morning. After an extensive discussion of the risks and benefits of surgery, she agreed to proceed with cholecystectomy.  Findings: Cholelithiasis without signs of acute cholecystitis.  Procedure details: Informed consent was obtained in the preoperative area prior to the procedure. The patient was brought to the operating room and placed on the table in the supine position. General anesthesia was induced and appropriate lines and drains were placed for intraoperative monitoring. Perioperative antibiotics were administered per SCIP guidelines. The abdomen was prepped and draped in the usual sterile fashion. A pre-procedure timeout was taken verifying patient identity, surgical site and procedure to be performed.  A small infraumbilical skin incision was made, the subcutaneous tissue was divided with cautery, and the umbilical stalk was grasped and elevated. A Veress needle was inserted through the fascia, intraperitoneal placement was confirmed with the saline drop test, and the abdomen was insufflated. A 26m port was placed. The peritoneal cavity was inspected with no evidence of visceral or vascular injury. Three 5107mports were placed in the right subcostal margin, all under direct visualization. The fundus of the gallbladder was grasped and retracted cephalad. The infundibulum was retracted laterally. The cystic triangle was dissected out using cautery and blunt dissection, and the critical view of safety was obtained. The  cystic duct and cystic artery were clipped and ligated. There appeared to be a small amount of bile leaking from the cystic duct stump, so a PDS endoloop was also placed on the cystic duct stump. The gallbladder was taken off the liver using cautery, and the specimen was placed in an endocatch bag and extracted via the umbilical port site. The surgical site was irrigated with saline until the effluent was clear. There was bleeding from the surface of the gallbladder fossa, which was controlled with cautery. The cystic duct and artery stumps were visually inspected and there was no evidence of bile leak or bleeding. Arista was applied to the gallbladder fossa. The umbilical port site fascia was closed with a 0 Vicryl suture using a PMI. The remaining ports were removed and the abdomen was desufflated. The skin at all port sites was closed with 4-0 monocryl subcuticular suture. Dermabond was applied.  The patient tolerated the procedure well with no apparent complications. All counts were correct x2 at the end of the procedure. The patient was extubated and taken to PACU in stable condition.  ShMichaelle BirksMD 03/04/22 4:22 PM

## 2022-03-04 NOTE — TOC CM/SW Note (Signed)
  Transition of Care [facility]) Screening Note   Patient Details  Name: Jessica Solomon Date of Birth: 2000/04/15   [facility] of [facility]) CM/SW Contact:    Lennart Pall, LCSW Phone Number: 03/04/2022, 2:06 PM    Transition of Care Department [facility]) has reviewed patient and no TOC needs have been identified at this time. We will continue to monitor patient advancement through interdisciplinary progression rounds. If new patient transition needs arise, please place a TOC consult.

## 2022-03-04 NOTE — Progress Notes (Signed)
MRI, MRCP: Tiny gallstones in fundus, no biliary ductal dilatation. No indication for ERCP. GI will sign off, please recall if needed.

## 2022-03-04 NOTE — Anesthesia Procedure Notes (Signed)
Procedure Name: Intubation Date/Time: 03/04/2022 8:33 AM  Performed by: Claudia Desanctis, CRNAPre-anesthesia Checklist: Patient identified, Emergency Drugs available, Suction available and Patient being monitored Patient Re-evaluated:Patient Re-evaluated prior to induction Oxygen Delivery Method: Circle system utilized Preoxygenation: Pre-oxygenation with 100% oxygen Induction Type: IV induction Ventilation: Mask ventilation without difficulty Laryngoscope Size: 2 and Miller Grade View: Grade I Tube type: Oral Tube size: 7.5 mm Number of attempts: 1 Airway Equipment and Method: Stylet Placement Confirmation: ETT inserted through vocal cords under direct vision, positive ETCO2 and breath sounds checked- equal and bilateral Secured at: 21 cm Tube secured with: Tape Dental Injury: Teeth and Oropharynx as per pre-operative assessment

## 2022-03-04 NOTE — Discharge Instructions (Signed)
[facility]  Please arrive at least 30 min before your appointment to complete your check in paperwork.  If you are unable to arrive 30 min prior to your appointment time we may have to cancel or reschedule you. LAPAROSCOPIC SURGERY: POST OP INSTRUCTIONS Always review your discharge instruction sheet given to you by the facility where your surgery was performed. IF YOU HAVE DISABILITY OR FAMILY LEAVE FORMS, YOU MUST BRING THEM TO THE OFFICE FOR PROCESSING.   DO NOT GIVE THEM TO YOUR DOCTOR.  PAIN CONTROL  First take acetaminophen (Tylenol) AND/or ibuprofen (Advil) to control your pain after surgery.  Follow directions on package.  Taking acetaminophen (Tylenol) and/or ibuprofen (Advil) regularly after surgery will help to control your pain and lower the amount of prescription pain medication you may need.  You should not take more than 4,000 mg (4 grams) of acetaminophen (Tylenol) in 24 hours.  You should not take ibuprofen (Advil), aleve, motrin, naprosyn or other NSAIDS if you have a history of stomach ulcers or chronic kidney disease.  A prescription for pain medication may be given to you upon discharge.  Take your pain medication as prescribed, if you still have uncontrolled pain after taking acetaminophen (Tylenol) or ibuprofen (Advil). Use ice packs to help control pain. If you need a refill on your pain medication, please contact your pharmacy.  They will contact our office to request authorization. Prescriptions will not be filled after 5pm or on week-ends.  HOME MEDICATIONS Take your usually prescribed medications unless otherwise directed.  DIET You should follow a light diet the first few days after arrival home.  Be sure to include lots of fluids daily. Avoid fatty, fried foods.   CONSTIPATION It is common to experience some constipation after surgery and if you are taking pain medication.  Increasing fluid intake and taking a stool softener (such as Colace)  will usually help or prevent this problem from occurring.  A mild laxative (Milk of Magnesia or Miralax) should be taken according to package instructions if there are no bowel movements after 48 hours.  WOUND/INCISION CARE Most patients will experience some swelling and bruising in the area of the incisions.  Ice packs will help.  Swelling and bruising can take several days to resolve.  Unless discharge instructions indicate otherwise, follow guidelines below  STERI-STRIPS - you may remove your outer bandages 48 hours after surgery, and you may shower at that time.  You have steri-strips (small skin tapes) in place directly over the incision.  These strips should be left on the skin for 7-10 days.   DERMABOND/SKIN GLUE - you may shower in 24 hours.  The glue will flake off over the next 2-3 weeks. Any sutures or staples will be removed at the office during your follow-up visit.  ACTIVITIES You may resume regular (light) daily activities beginning the next day--such as daily self-care, walking, climbing stairs--gradually increasing activities as tolerated.  You may have sexual intercourse when it is comfortable.  Refrain from any heavy lifting or straining until approved by your doctor. You may drive when you are no longer taking prescription pain medication, you can comfortably wear a seatbelt, and you can safely maneuver your car and apply brakes.  FOLLOW-UP You should see your doctor in the office for a follow-up appointment approximately 2-3 weeks after your surgery.  You should have been given your post-op/follow-up appointment when your surgery was scheduled.  If you did not receive a post-op/follow-up appointment, make sure   that you call for this appointment within a day or two after you arrive home to insure a convenient appointment time.  OTHER INSTRUCTIONS  WHEN TO CALL YOUR DOCTOR: Fever over 101.0 Inability to urinate Continued bleeding from incision. Increased pain, redness, or  drainage from the incision. Increasing abdominal pain  The clinic staff is available to answer your questions during regular business hours.  Please don't hesitate to call and ask to speak to one of the nurses for clinical concerns.  If you have a medical emergency, go to the nearest emergency room or call 911.  A surgeon from [facility] is always on call at the hospital. [address], [address], [place], [place]  [address] ? [address] [address], [place], [place]   [address] [phone] ? [phone] ? FAX [phone]   

## 2022-03-04 NOTE — Progress Notes (Signed)
[facility]                                                                              Glendolyn Melia, is a 22 y.o. female, DOB - 2000-01-26, [id number] Admit date - 03/02/2022    Outpatient Primary MD for the patient is Duard Larsen, MD  LOS - 2  days  Chief Complaint  Patient presents with   Heartburn       Brief summary   Patient is a 22 year old female, 4 months postpartum, currently not breast-feeding, presented with right upper quadrant abdominal pain, nausea vomiting but no fevers.   In ED, found to have elevated LFTs and hyperbilirubinemia, underwent ultrasound which showed cholelithiasis without acute cholecystitis with CBD of 5 mm. Patient was admitted for further workup   Assessment & Plan    Principal Problem:   RUQ abdominal pain with acute transaminitis, cholelithiasis -Ultrasound showed cholelithiasis without acute cholecystitis, CBD 5 mm. -Hepatitis panel normal, lipase normal,  -GI, general surgery consulted. -MRCP showed gallstones, LFTs improving.  -Underwent laparoscopic cholecystectomy this morning -Seen after surgery, will start liquid diet with lunch and advance to solids this evening  Active Problems:   Asthma, mild persistent -Currently stable, no wheezing  Microcytic anemia, iron deficiency -H&H stable, 9.4 -Anemia panel showed TIBC 490, ferritin 13, iron 87  Hypokalemia -Replete as needed  Obesity Estimated body mass index is 43.09 kg/m as calculated from the following:   Height as of this encounter: '5\' 6"'$  (1.676 m).   Weight as of this encounter: 121.1 kg.  Code Status: Full code DVT Prophylaxis:  SCDs Start: 03/02/22 2153   Level of Care: Level of care: Med-Surg Family Communication: Updated patient's mother and aunt at the bedside Disposition Plan:      Remains inpatient appropriate:     Procedures:  Right upper quadrant ultrasound 2/28 Laparoscopic cholecystectomy   Consultants:   GI, [facility], Dr.  Therisa Doyne General surgery  Antimicrobials:   Anti-infectives (From admission, onward)    Start     Dose/Rate Route Frequency Ordered Stop   03/04/22 0600  cefTRIAXone (ROCEPHIN) 2 g in sodium chloride 0.9 % 100 mL IVPB        2 g 200 mL/hr over 30 Minutes Intravenous On call to O.R. 03/03/22 1505 03/04/22 0840          Medications  Chlorhexidine Gluconate Cloth  6 each Topical Q0600   HYDROmorphone       mupirocin ointment  1 Application Nasal BID   sodium chloride flush  3 mL Intravenous Q12H      Subjective:   Jessica Solomon was seen and examined today.  Seen after surgery, somewhat sleepy but feeling better overall and hungry.  Family members at the bedside.  No nausea vomiting, fevers or chills.  Objective:   Vitals:   03/04/22 0945 03/04/22 1000 03/04/22 1034 03/04/22 1130  BP: 127/62 127/61 131/75 128/74  Pulse: 90 68 76 75  Resp: '16 19 17 18  '$ Temp:   99 F (37.2 C) 99 F (37.2 C)  TempSrc:   Oral Oral  SpO2: 99% 100% 100% 100%  Weight:  Height:        Intake/Output Summary (Last 24 hours) at 03/04/2022 1310 Last data filed at 03/04/2022 1000 Gross per 24 hour  Intake 3101.8 ml  Output 725 ml  Net 2376.8 ml     Wt Readings from Last 3 Encounters:  03/04/22 121.1 kg (>99 %, Z= 2.65)*  10/20/21 121.1 kg (>99 %, Z= 2.63)*  10/19/21 120.5 kg (>99 %, Z= 2.62)*   * Growth percentiles are based on CDC (Girls, 2-20 Years) data.   Physical Exam General: Sleepy but easily arousable and oriented Cardiovascular: S1 S2 clear, RRR.  Respiratory: CTAB, no wheezing, rales or rhonchi Gastrointestinal: Soft,  nondistended, NBS Ext: no pedal edema bilaterally Neuro: no new deficits Psych: Normal affect   Data Reviewed:  I have personally reviewed following labs    CBC Lab Results  Component Value Date   WBC 6.6 03/04/2022   RBC 4.07 03/04/2022   HGB 9.4 (L) 03/04/2022   HCT 30.8 (L) 03/04/2022   MCV 75.7 (L) 03/04/2022   MCH 23.1 (L) 03/04/2022    PLT 243 03/04/2022   MCHC 30.5 03/04/2022   RDW 16.3 (H) 03/04/2022   LYMPHSABS 1.1 03/02/2022   MONOABS 0.3 03/02/2022   EOSABS 0.1 03/02/2022   BASOSABS 0.0 0000000     Last metabolic panel Lab Results  Component Value Date   NA 136 03/04/2022   K 3.9 03/04/2022   CL 108 03/04/2022   CO2 23 03/04/2022   BUN 6 03/04/2022   CREATININE 0.59 03/04/2022   GLUCOSE 82 03/04/2022   GFRNONAA >60 03/04/2022   CALCIUM 8.4 (L) 03/04/2022   PROT 6.4 (L) 03/04/2022   ALBUMIN 3.3 (L) 03/04/2022   BILITOT 1.8 (H) 03/04/2022   ALKPHOS 145 (H) 03/04/2022   AST 110 (H) 03/04/2022   ALT 259 (H) 03/04/2022   ANIONGAP 5 03/04/2022    CBG (last 3)  No results for input(s): "GLUCAP" in the last 72 hours.    Coagulation Profile: No results for input(s): "INR", "PROTIME" in the last 168 hours.   Radiology Studies: I have personally reviewed the imaging studies  MR ABDOMEN MRCP W WO CONTAST  Result Date: 03/03/2022 CLINICAL DATA:  Hepatitis, abdominal pain, nausea and vomiting for 2 weeks ,elevated LFTs, cholelithiasis EXAM: MRI ABDOMEN WITHOUT AND WITH CONTRAST (INCLUDING MRCP) TECHNIQUE: Multiplanar multisequence MR imaging of the abdomen was performed both before and after the administration of intravenous contrast. Heavily T2-weighted images of the biliary and pancreatic ducts were obtained, and three-dimensional MRCP images were rendered by post processing. CONTRAST:  60m GADAVIST GADOBUTROL 1 MMOL/ML IV SOLN COMPARISON:  Abdominal ultrasound, 03/02/2022 FINDINGS: Lower chest: No acute abnormality. Hepatobiliary: No solid liver abnormality is seen. Tiny gallstones in the gallbladder fundus (series 4, image 12). No gallbladder wall thickening, or biliary dilatation. Pancreas: Unremarkable. No pancreatic ductal dilatation or surrounding inflammatory changes. Spleen: Normal in size without significant abnormality. Adrenals/Urinary Tract: Adrenal glands are unremarkable. Kidneys are normal,  without renal calculi, solid lesion, or hydronephrosis. Stomach/Bowel: Stomach is within normal limits. No evidence of bowel wall thickening, distention, or inflammatory changes. Vascular/Lymphatic: No significant vascular findings are present. No enlarged abdominal lymph nodes. Other: No abdominal wall hernia or abnormality. No ascites. Musculoskeletal: No acute or significant osseous findings. IMPRESSION: 1. Tiny gallstones. No gallbladder wall thickening or biliary ductal dilatation. 2. No acute MR findings. Electronically Signed   By: ADelanna AhmadiM.D.   On: 03/03/2022 14:58   MR 3D Recon At Scanner  Result Date: 03/03/2022  CLINICAL DATA:  Hepatitis, abdominal pain, nausea and vomiting for 2 weeks ,elevated LFTs, cholelithiasis EXAM: MRI ABDOMEN WITHOUT AND WITH CONTRAST (INCLUDING MRCP) TECHNIQUE: Multiplanar multisequence MR imaging of the abdomen was performed both before and after the administration of intravenous contrast. Heavily T2-weighted images of the biliary and pancreatic ducts were obtained, and three-dimensional MRCP images were rendered by post processing. CONTRAST:  77m GADAVIST GADOBUTROL 1 MMOL/ML IV SOLN COMPARISON:  Abdominal ultrasound, 03/02/2022 FINDINGS: Lower chest: No acute abnormality. Hepatobiliary: No solid liver abnormality is seen. Tiny gallstones in the gallbladder fundus (series 4, image 12). No gallbladder wall thickening, or biliary dilatation. Pancreas: Unremarkable. No pancreatic ductal dilatation or surrounding inflammatory changes. Spleen: Normal in size without significant abnormality. Adrenals/Urinary Tract: Adrenal glands are unremarkable. Kidneys are normal, without renal calculi, solid lesion, or hydronephrosis. Stomach/Bowel: Stomach is within normal limits. No evidence of bowel wall thickening, distention, or inflammatory changes. Vascular/Lymphatic: No significant vascular findings are present. No enlarged abdominal lymph nodes. Other: No abdominal wall hernia  or abnormality. No ascites. Musculoskeletal: No acute or significant osseous findings. IMPRESSION: 1. Tiny gallstones. No gallbladder wall thickening or biliary ductal dilatation. 2. No acute MR findings. Electronically Signed   By: ADelanna AhmadiM.D.   On: 03/03/2022 14:58   UKoreaAbdomen Limited RUQ (LIVER/GB)  Result Date: 03/02/2022 CLINICAL DATA:  Two views history of right upper quadrant pain EXAM: ULTRASOUND ABDOMEN LIMITED RIGHT UPPER QUADRANT COMPARISON:  None Available. FINDINGS: Gallbladder: There are a few layering gallstones the largest which measures up to 6 mm. No wall thickening visualized. No sonographic Murphy sign noted by sonographer. Common bile duct: Diameter: 5 mm, normal Liver: Limitations: The left hepatic lobe is not visualized and could not be assessed to body habitus. The right hepatic lobe poorly visualized and incompletely assessed for the presence focal hepatic lesions. Portal vein is patent on color Doppler imaging with normal direction of blood flow towards the liver. Other: None. IMPRESSION: Cholelithiasis without sonographic finding to suggest acute cholecystitis. Electronically Signed   By: HMarin RobertsM.D.   On: 03/02/2022 18:02       Isamar Nazir M.D. [facility] 03/04/2022, 1:10 PM  Available via Epic secure chat 7am-7pm After 7 pm, please refer to night coverage provider listed on amion.

## 2022-03-05 ENCOUNTER — Encounter (HOSPITAL_COMMUNITY): Payer: Self-pay | Admitting: Surgery

## 2022-03-05 LAB — COMPREHENSIVE METABOLIC PANEL
ALT: 212 U/L — ABNORMAL HIGH (ref 0–44)
AST: 93 U/L — ABNORMAL HIGH (ref 15–41)
Albumin: 3.3 g/dL — ABNORMAL LOW (ref 3.5–5.0)
Alkaline Phosphatase: 160 U/L — ABNORMAL HIGH (ref 38–126)
Anion gap: 5 (ref 5–15)
BUN: 6 mg/dL (ref 6–20)
CO2: 22 mmol/L (ref 22–32)
Calcium: 8.1 mg/dL — ABNORMAL LOW (ref 8.9–10.3)
Chloride: 108 mmol/L (ref 98–111)
Creatinine, Ser: 0.6 mg/dL (ref 0.44–1.00)
GFR, Estimated: >60 mL/min (ref >60)
Glucose, Bld: 105 mg/dL — ABNORMAL HIGH (ref 70–99)
Potassium: 3.4 mmol/L — ABNORMAL LOW (ref 3.5–5.1)
Sodium: 135 mmol/L (ref 135–145)
Total Bilirubin: 1.1 mg/dL (ref 0.3–1.2)
Total Protein: 6.6 g/dL (ref 6.5–8.1)

## 2022-03-05 LAB — CBC
HCT: 28.5 % — ABNORMAL LOW (ref 36.0–46.0)
Hemoglobin: 8.8 g/dL — ABNORMAL LOW (ref 12.0–15.0)
MCH: 23.4 pg — ABNORMAL LOW (ref 26.0–34.0)
MCHC: 30.9 g/dL (ref 30.0–36.0)
MCV: 75.8 fL — ABNORMAL LOW (ref 80.0–100.0)
Platelets: 208 10*3/uL (ref 150–400)
RBC: 3.76 MIL/uL — ABNORMAL LOW (ref 3.87–5.11)
RDW: 16.8 % — ABNORMAL HIGH (ref 11.5–15.5)
WBC: 11.6 10*3/uL — ABNORMAL HIGH (ref 4.0–10.5)
nRBC: 0 % (ref 0.0–0.2)

## 2022-03-05 LAB — SURGICAL PATHOLOGY

## 2022-03-05 MED ORDER — IBUPROFEN 600 MG PO TABS: 600.0000 mg | ORAL_TABLET | Freq: Four times a day (QID) | ORAL | 0 refills | Status: AC | PRN

## 2022-03-05 MED ORDER — POLYETHYLENE GLYCOL 3350 17 G PO PACK: 17.0000 g | PACK | Freq: Every day | ORAL | 0 refills | Status: AC | PRN

## 2022-03-05 MED ORDER — ACETAMINOPHEN 500 MG PO TABS: 1000.0000 mg | ORAL_TABLET | Freq: Four times a day (QID) | ORAL | 0 refills | Status: AC | PRN

## 2022-03-05 MED ORDER — OXYCODONE HCL 5 MG PO TABS: 5.0000 mg | ORAL_TABLET | Freq: Four times a day (QID) | ORAL | 0 refills | Status: AC | PRN

## 2022-03-05 MED ORDER — ONDANSETRON HCL 4 MG PO TABS: 4.0000 mg | ORAL_TABLET | Freq: Three times a day (TID) | ORAL | 0 refills | Status: AC | PRN

## 2022-03-05 MED ORDER — POTASSIUM CHLORIDE CRYS ER 20 MEQ PO TBCR
40.0000 meq | EXTENDED_RELEASE_TABLET | Freq: Once | ORAL | Status: AC
  Administered 2022-03-05: 40 meq via ORAL
  Filled 2022-03-05: qty 2

## 2022-03-05 NOTE — Progress Notes (Signed)
[facility] Progress Note  1 Day Post-Op  Subjective: CC:  No acute events overnight. Reports feeling sore, worse with movement, but improved by pain meds and laying still. States she was able to sleep last night. Reports tolerating PO and mobilizing independently to the bathroom. No reported urinary sxs. +flatus.   Objective: Vital signs in last 24 hours: Temp:  [97.2 F (36.2 C)-99.3 F (37.4 C)] 99.3 F (37.4 C) (02/29 0535) Pulse Rate:  [68-96] 96 (02/29 0535) Resp:  [16-22] 16 (02/29 0535) BP: (119-145)/(61-84) 119/61 (02/29 0535) SpO2:  [98 %-100 %] 98 % (02/29 0535) Last BM Date : 03/02/22  Intake/Output from previous day: 02/28 0701 - 02/29 0700 In: 3420.6 [P.O.:840; I.V.:2480.6; IV Piggyback:100] Out: [id number] [Urine:3400; Blood:25] Intake/Output this shift: No intake/output data recorded.  PE: Gen:  Alert, NAD, pleasant Card:  Regular rate and rhythm Pulm:  Normal effort ORA Abd: Soft, appropriately tender, incisions c/d/I  Skin: warm and dry, no rashes  Psych: A&Ox3   Lab Results:  Recent Labs    03/04/22 0609 03/05/22 0437  WBC 6.6 11.6*  HGB 9.4* 8.8*  HCT 30.8* 28.5*  PLT 243 208   BMET Recent Labs    03/04/22 0609 03/05/22 0437  NA 136 135  K 3.9 3.4*  CL 108 108  CO2 23 22  GLUCOSE 82 105*  BUN 6 6  CREATININE 0.59 0.60  CALCIUM 8.4* 8.1*   PT/INR No results for input(s): "LABPROT", "INR" in the last 72 hours. CMP     Component Value Date/Time   NA 135 03/05/2022 0437   K 3.4 (L) 03/05/2022 0437   CL 108 03/05/2022 0437   CO2 22 03/05/2022 0437   GLUCOSE 105 (H) 03/05/2022 0437   BUN 6 03/05/2022 0437   CREATININE 0.60 03/05/2022 0437   CALCIUM 8.1 (L) 03/05/2022 0437   PROT 6.6 03/05/2022 0437   ALBUMIN 3.3 (L) 03/05/2022 0437   AST 93 (H) 03/05/2022 0437   ALT 212 (H) 03/05/2022 0437   ALKPHOS 160 (H) 03/05/2022 0437   BILITOT 1.1 03/05/2022 0437   GFRNONAA >60 03/05/2022 0437   Lipase     Component Value  Date/Time   LIPASE 46 03/02/2022 1720       Studies/Results: MR ABDOMEN MRCP W WO CONTAST  Result Date: 03/03/2022 CLINICAL DATA:  Hepatitis, abdominal pain, nausea and vomiting for 2 weeks ,elevated LFTs, cholelithiasis EXAM: MRI ABDOMEN WITHOUT AND WITH CONTRAST (INCLUDING MRCP) TECHNIQUE: Multiplanar multisequence MR imaging of the abdomen was performed both before and after the administration of intravenous contrast. Heavily T2-weighted images of the biliary and pancreatic ducts were obtained, and three-dimensional MRCP images were rendered by post processing. CONTRAST:  12m GADAVIST GADOBUTROL 1 MMOL/ML IV SOLN COMPARISON:  Abdominal ultrasound, 03/02/2022 FINDINGS: Lower chest: No acute abnormality. Hepatobiliary: No solid liver abnormality is seen. Tiny gallstones in the gallbladder fundus (series 4, image 12). No gallbladder wall thickening, or biliary dilatation. Pancreas: Unremarkable. No pancreatic ductal dilatation or surrounding inflammatory changes. Spleen: Normal in size without significant abnormality. Adrenals/Urinary Tract: Adrenal glands are unremarkable. Kidneys are normal, without renal calculi, solid lesion, or hydronephrosis. Stomach/Bowel: Stomach is within normal limits. No evidence of bowel wall thickening, distention, or inflammatory changes. Vascular/Lymphatic: No significant vascular findings are present. No enlarged abdominal lymph nodes. Other: No abdominal wall hernia or abnormality. No ascites. Musculoskeletal: No acute or significant osseous findings. IMPRESSION: 1. Tiny gallstones. No gallbladder wall thickening or biliary ductal dilatation. 2. No acute MR findings. Electronically Signed  By: Delanna Ahmadi M.D.   On: 03/03/2022 14:58   MR 3D Recon At Scanner  Result Date: 03/03/2022 CLINICAL DATA:  Hepatitis, abdominal pain, nausea and vomiting for 2 weeks ,elevated LFTs, cholelithiasis EXAM: MRI ABDOMEN WITHOUT AND WITH CONTRAST (INCLUDING MRCP) TECHNIQUE:  Multiplanar multisequence MR imaging of the abdomen was performed both before and after the administration of intravenous contrast. Heavily T2-weighted images of the biliary and pancreatic ducts were obtained, and three-dimensional MRCP images were rendered by post processing. CONTRAST:  64m GADAVIST GADOBUTROL 1 MMOL/ML IV SOLN COMPARISON:  Abdominal ultrasound, 03/02/2022 FINDINGS: Lower chest: No acute abnormality. Hepatobiliary: No solid liver abnormality is seen. Tiny gallstones in the gallbladder fundus (series 4, image 12). No gallbladder wall thickening, or biliary dilatation. Pancreas: Unremarkable. No pancreatic ductal dilatation or surrounding inflammatory changes. Spleen: Normal in size without significant abnormality. Adrenals/Urinary Tract: Adrenal glands are unremarkable. Kidneys are normal, without renal calculi, solid lesion, or hydronephrosis. Stomach/Bowel: Stomach is within normal limits. No evidence of bowel wall thickening, distention, or inflammatory changes. Vascular/Lymphatic: No significant vascular findings are present. No enlarged abdominal lymph nodes. Other: No abdominal wall hernia or abnormality. No ascites. Musculoskeletal: No acute or significant osseous findings. IMPRESSION: 1. Tiny gallstones. No gallbladder wall thickening or biliary ductal dilatation. 2. No acute MR findings. Electronically Signed   By: ADelanna AhmadiM.D.   On: 03/03/2022 14:58    Anti-infectives: Anti-infectives (From admission, onward)    Start     Dose/Rate Route Frequency Ordered Stop   03/04/22 0600  cefTRIAXone (ROCEPHIN) 2 g in sodium chloride 0.9 % 100 mL IVPB        2 g 200 mL/hr over 30 Minutes Intravenous On call to O.R. 03/03/22 1505 03/04/22 0840        Assessment/Plan  Cholelithiasis Calculous cholecystitis POD#1 s/p laparoscopic cholecystectomy by Dr. AZenia Resides2/28 - stable for discharge from a surgical perspective. Patient works at [employer]and has a 457month old baby. Lifting  restrictions and post-operative care discussed with the patient. Work note and pain Rx provided.       LOS: 3 days   I reviewed nursing notes, Consultant GI notes, hospitalist notes, last 24 h vitals and pain scores, last 48 h intake and output, last 24 h labs and trends, and last 24 h imaging results.   EObie Dredge PA-C [facility] Please see Amion for pager number during day hours 7:00am-4:30pm

## 2022-03-05 NOTE — Discharge Summary (Signed)
Physician Discharge Summary   Patient: Jessica Solomon MRN: [id number] DOB: May 07, 2000  Admit date:     03/02/2022  Discharge date: 03/05/22  Discharge Physician: Estill Cotta, MD    PCP: Duard Larsen, MD   Recommendations at discharge:   Outpatient follow-up with general surgery scheduled  Discharge Diagnoses:    RUQ abdominal pain Symptomatic cholelithiasis, status post laparoscopic cholecystectomy   Asthma, mild persistent   Transaminitis    Hospital Course: Patient is a 22 year old female, 4 months postpartum, currently not breast-feeding, presented with right upper quadrant abdominal pain, nausea vomiting but no fevers.   In ED, found to have elevated LFTs and hyperbilirubinemia, underwent ultrasound which showed cholelithiasis without acute cholecystitis with CBD of 5 mm. Patient was admitted for further workup  Assessment and Plan:  RUQ abdominal pain with acute transaminitis, cholelithiasis -Ultrasound showed cholelithiasis without acute cholecystitis, CBD 5 mm. -Hepatitis panel normal, lipase normal,  -GI, general surgery was consulted. -MRCP showed gallstones, LFTs improving.  -Underwent laparoscopic cholecystectomy on 03/04/2022. -Tolerating diet, cleared by surgery to discharge home.    Asthma, mild persistent -Currently stable, no wheezing   Microcytic anemia, iron deficiency -Anemia panel showed TIBC 490, ferritin 13, iron 87 Hemoglobin 8.8   Hypokalemia -K3.4, replaced   Obesity Estimated body mass index is 43.09 kg/m as calculated from the following:   Height as of this encounter: '5\' 6"'$  (1.676 m).   Weight as of this encounter: 121.1 kg.      Pain control - [employer] Controlled Substance Reporting System database was reviewed. and patient was instructed, not to drive, operate heavy machinery, perform activities at heights, swimming or participation in water activities or provide baby-sitting services while on Pain, Sleep and Anxiety  Medications; until their outpatient Physician has advised to do so again. Also recommended to not to take more than prescribed Pain, Sleep and Anxiety Medications.  Consultants: Gastroenterology, general surgery Procedures performed: MRCP, laparoscopic cholecystectomy Disposition: Home Diet recommendation:  Discharge Diet Orders (From admission, onward)     Start     Ordered   03/05/22 0000  Diet - low sodium heart healthy        03/05/22 0822           Cardiac diet DISCHARGE MEDICATION: Allergies as of 03/05/2022       Reactions   Flonase [fluticasone Propionate] Other (See Comments)   "nosebleeds"        Medication List     TAKE these medications    acetaminophen 500 MG tablet Commonly known as: TYLENOL Take 2 tablets (1,000 mg total) by mouth every 6 (six) hours as needed. What changed:  medication strength how much to take when to take this reasons to take this   albuterol 108 (90 Base) MCG/ACT inhaler Commonly known as: VENTOLIN HFA Inhale 2 puffs into the lungs every 6 (six) hours as needed for wheezing or shortness of breath. What changed: when to take this   ferrous gluconate 324 MG tablet Commonly known as: FERGON Take 1 tablet (324 mg total) by mouth daily with breakfast.   ibuprofen 600 MG tablet Commonly known as: ADVIL Take 1 tablet (600 mg total) by mouth every 6 (six) hours as needed for up to 8 days for mild pain. What changed:  when to take this reasons to take this   ondansetron 4 MG tablet Commonly known as: ZOFRAN Take 1 tablet (4 mg total) by mouth every 8 (eight) hours as needed for nausea or vomiting.   oxyCODONE  5 MG immediate release tablet Commonly known as: Oxy IR/ROXICODONE Take 1 tablet (5 mg total) by mouth every 6 (six) hours as needed for severe pain (not relieved by tylenol or advil).   polyethylene glycol 17 g packet Commonly known as: MIRALAX / GLYCOLAX Take 17 g by mouth daily as needed for mild constipation or  moderate constipation.        Follow-up Information     Maczis, Carlena Hurl, [place]. Go on 03/24/2022.   Specialty: General Surgery Why: Your appointment is 03/24/22 at 11 am Arrive 37mn early to check in, fill out paperwork, Bring photo ID and insurance information Contact information: [phone]         JDuard Larsen MD. Schedule an appointment as soon as possible for a visit in 2 week(s).   Specialty: Pediatrics Why: for hospital follow-up Contact information: [address] [phone]                Discharge Exam: FDanley DankerWeights   03/02/22 1649 03/04/22 0737  Weight: 121.1 kg 121.1 kg   S: No acute complaints, family member at the bedside.  No abdominal pain nausea vomiting  BP 122/65 (BP Location: Right Arm)   Pulse 87   Temp 99.3 F (37.4 C) (Oral)   Resp 16   Ht '5\' 6"'$  (1.676 m)   Wt 121.1 kg   SpO2 100%   BMI 43.09 kg/m   Physical Exam General: Alert and oriented x 3, NAD Cardiovascular: S1 S2 clear, RRR.  Respiratory: CTAB, no wheezing, rales or rhonchi Gastrointestinal: Soft, incision CDI, nondistended, NBS Ext: no pedal edema bilaterally Neuro: no new deficits Psych: Normal affect   Condition at discharge: good  The results of significant diagnostics from this hospitalization (including imaging, microbiology, ancillary and laboratory) are listed below for reference.   Imaging Studies: MR ABDOMEN MRCP W WO CONTAST  Result Date: 03/03/2022 CLINICAL DATA:  Hepatitis, abdominal pain, nausea and vomiting for 2 weeks ,elevated LFTs, cholelithiasis EXAM: MRI ABDOMEN WITHOUT AND WITH CONTRAST (INCLUDING MRCP) TECHNIQUE: Multiplanar multisequence MR imaging of the abdomen was performed both before and after the administration of intravenous contrast. Heavily T2-weighted images of the biliary and pancreatic ducts were obtained, and  three-dimensional MRCP images were rendered by post processing. CONTRAST:  175mGADAVIST GADOBUTROL 1 MMOL/ML IV SOLN COMPARISON:  Abdominal ultrasound, 03/02/2022 FINDINGS: Lower chest: No acute abnormality. Hepatobiliary: No solid liver abnormality is seen. Tiny gallstones in the gallbladder fundus (series 4, image 12). No gallbladder wall thickening, or biliary dilatation. Pancreas: Unremarkable. No pancreatic ductal dilatation or surrounding inflammatory changes. Spleen: Normal in size without significant abnormality. Adrenals/Urinary Tract: Adrenal glands are unremarkable. Kidneys are normal, without renal calculi, solid lesion, or hydronephrosis. Stomach/Bowel: Stomach is within normal limits. No evidence of bowel wall thickening, distention, or inflammatory changes. Vascular/Lymphatic: No significant vascular findings are present. No enlarged abdominal lymph nodes. Other: No abdominal wall hernia or abnormality. No ascites. Musculoskeletal: No acute or significant osseous findings. IMPRESSION: 1. Tiny gallstones. No gallbladder wall thickening or biliary ductal dilatation. 2. No acute MR findings. Electronically Signed   By: AlDelanna Ahmadi.D.   On: 03/03/2022 14:58   MR 3D Recon At Scanner  Result Date: 03/03/2022 CLINICAL DATA:  Hepatitis, abdominal pain, nausea and vomiting for 2 weeks ,elevated LFTs, cholelithiasis EXAM: MRI ABDOMEN WITHOUT AND WITH CONTRAST (INCLUDING MRCP) TECHNIQUE: Multiplanar multisequence MR imaging of the  abdomen was performed both before and after the administration of intravenous contrast. Heavily T2-weighted images of the biliary and pancreatic ducts were obtained, and three-dimensional MRCP images were rendered by post processing. CONTRAST:  84m GADAVIST GADOBUTROL 1 MMOL/ML IV SOLN COMPARISON:  Abdominal ultrasound, 03/02/2022 FINDINGS: Lower chest: No acute abnormality. Hepatobiliary: No solid liver abnormality is seen. Tiny gallstones in the gallbladder fundus (series  4, image 12). No gallbladder wall thickening, or biliary dilatation. Pancreas: Unremarkable. No pancreatic ductal dilatation or surrounding inflammatory changes. Spleen: Normal in size without significant abnormality. Adrenals/Urinary Tract: Adrenal glands are unremarkable. Kidneys are normal, without renal calculi, solid lesion, or hydronephrosis. Stomach/Bowel: Stomach is within normal limits. No evidence of bowel wall thickening, distention, or inflammatory changes. Vascular/Lymphatic: No significant vascular findings are present. No enlarged abdominal lymph nodes. Other: No abdominal wall hernia or abnormality. No ascites. Musculoskeletal: No acute or significant osseous findings. IMPRESSION: 1. Tiny gallstones. No gallbladder wall thickening or biliary ductal dilatation. 2. No acute MR findings. Electronically Signed   By: ADelanna AhmadiM.D.   On: 03/03/2022 14:58   UKoreaAbdomen Limited RUQ (LIVER/GB)  Result Date: 03/02/2022 CLINICAL DATA:  Two views history of right upper quadrant pain EXAM: ULTRASOUND ABDOMEN LIMITED RIGHT UPPER QUADRANT COMPARISON:  None Available. FINDINGS: Gallbladder: There are a few layering gallstones the largest which measures up to 6 mm. No wall thickening visualized. No sonographic Murphy sign noted by sonographer. Common bile duct: Diameter: 5 mm, normal Liver: Limitations: The left hepatic lobe is not visualized and could not be assessed to body habitus. The right hepatic lobe poorly visualized and incompletely assessed for the presence focal hepatic lesions. Portal vein is patent on color Doppler imaging with normal direction of blood flow towards the liver. Other: None. IMPRESSION: Cholelithiasis without sonographic finding to suggest acute cholecystitis. Electronically Signed   By: HMarin RobertsM.D.   On: 03/02/2022 18:02    Microbiology: Results for orders placed or performed during the hospital encounter of 03/02/22  Surgical pcr screen     Status: Abnormal    Collection Time: 03/03/22  7:10 PM   Specimen: Nasal Mucosa; Nasal Swab  Result Value Ref Range Status   MRSA, PCR NEGATIVE NEGATIVE Final   Staphylococcus aureus POSITIVE (A) NEGATIVE Final    Comment: (NOTE) The Xpert SA Assay (FDA approved for NASAL specimens in patients 270years of age and older), is one component of a comprehensive surveillance program. It is not intended to diagnose infection nor to guide or monitor treatment. Performed at [facility] [address], [place] [id number]    Labs: CBC: Recent Labs  Lab 03/02/22 1720 03/03/22 0449 03/04/22 0609 03/05/22 0437  WBC 5.9 5.2 6.6 11.6*  NEUTROABS 4.4  --   --   --   HGB 9.9* 9.3* 9.4* 8.8*  HCT 32.0* 34.0* 30.8* 28.5*  MCV 73.9* 84.6 75.7* 75.8*  PLT 291 220 243 [id number]  Basic Metabolic Panel: Recent Labs  Lab 03/02/22 1720 03/03/22 0449 03/04/22 0609 03/05/22 0437  NA 136 138 136 135  K 3.2* 3.7 3.9 3.4*  CL 103 110 108 108  CO2 '24 23 23 22  '$ GLUCOSE 110* 91 82 105*  BUN '7 6 6 6  '$ CREATININE 0.58 0.62 0.59 0.60  CALCIUM 9.6 8.4* 8.4* 8.1*   Liver Function Tests: Recent Labs  Lab 03/02/22 1720 03/03/22 0449 03/04/22 0609 03/05/22 0437  AST 675* 369* 110* 93*  ALT 539* 382* 259* 212*  ALKPHOS 204* 173* 145* 160*  BILITOT 3.5* 3.4* 1.8* 1.1  PROT 7.7 6.8 6.4* 6.6  ALBUMIN 4.3 3.5 3.3* 3.3*   CBG: No results for input(s): "GLUCAP" in the last 168 hours.  Discharge time spent: less than 30 minutes.  Signed: Estill Cotta, MD [facility] 03/05/2022

## 2022-03-06 NOTE — Progress Notes (Signed)
Morphine 2 mg/ml waste noted for 1 mg with Noe Gens, RN and Blase Mess, RN.

## 2023-05-03 ENCOUNTER — Other Ambulatory Visit: Payer: Self-pay

## 2023-05-03 ENCOUNTER — Emergency Department (HOSPITAL_BASED_OUTPATIENT_CLINIC_OR_DEPARTMENT_OTHER)
Admission: EM | Admit: 2023-05-03 | Discharge: 2023-05-03 | Disposition: A | Discharge status: Home / Self Care | Attending: Emergency Medicine | Admitting: Emergency Medicine

## 2023-05-03 ENCOUNTER — Encounter (HOSPITAL_BASED_OUTPATIENT_CLINIC_OR_DEPARTMENT_OTHER): Payer: Self-pay

## 2023-05-03 ENCOUNTER — Emergency Department (HOSPITAL_BASED_OUTPATIENT_CLINIC_OR_DEPARTMENT_OTHER)

## 2023-05-03 DIAGNOSIS — F17210 Nicotine dependence, cigarettes, uncomplicated: Secondary | ICD-10-CM | POA: Diagnosis not present

## 2023-05-03 DIAGNOSIS — M25561 Pain in right knee: Secondary | ICD-10-CM | POA: Insufficient documentation

## 2023-05-03 DIAGNOSIS — J45909 Unspecified asthma, uncomplicated: Secondary | ICD-10-CM | POA: Insufficient documentation

## 2023-05-03 MED ORDER — ACETAMINOPHEN 325 MG PO TABS
650.0000 mg | ORAL_TABLET | Freq: Once | ORAL | Status: AC
  Administered 2023-05-03: 650 mg via ORAL
  Filled 2023-05-03: qty 2

## 2023-05-03 NOTE — Discharge Instructions (Signed)
 As discussed, x-ray did not show evidence of a fracture or dislocation.  I am concerned that you may have ligament or meniscal injury of your right knee.  Wear knee brace and crutches to help aid in walking until follow-up with orthopedics.  Recommend elevating your leg above the level of your heart to help with pain and swelling.  You may ice your knee as well to help with your symptoms.  Recommend use of ibuprofen /Aleve for treatment of pain if not concern for pregnancy.  Attaches number for orthopedics to follow-up with but will also send a referral as well.  Please not hesitate to return if the worrisome signs and symptoms we discussed become apparent.

## 2023-05-03 NOTE — ED Provider Notes (Signed)
 [facility] Solomon EMERGENCY DEPARTMENT AT [facility] Provider Note   CSN: [id number] Arrival date & time: 05/03/23  1054     History  Chief Complaint  Patient presents with   Jessica Solomon    Jessica Solomon is a 23 y.o. female.   Fall   22 year old female presents emergency department after a fall.  States that she did not see one of her toddler's toys behind her and tripped when she was walking backwards.  States that she fell backwards.  Unsure of exactly which way her knee went.  Denies trauma to head, LOC or pain from incident elsewhere.  States that she has baseline pain in bilateral knee due to being "knock-knee."  Reports increased pain since then.  Does report feelings of knee giving out when she steps a certain way.  Denies any weakness or sensory deficits distal to injury.  Has taken nothing for her symptoms prior to arrival.  Past medical history significant for asthma  Home Medications Prior to Admission medications   Medication Sig Start Date End Date Taking? Authorizing Provider  acetaminophen  (TYLENOL ) 500 MG tablet Take 2 tablets (1,000 mg total) by mouth every 6 (six) hours as needed. 03/05/22   Charlott Converse, PA-C  albuterol  (VENTOLIN  HFA) 108 (90 Base) MCG/ACT inhaler Inhale 2 puffs into the lungs every 6 (six) hours as needed for wheezing or shortness of breath. Patient taking differently: Inhale 2 puffs into the lungs 2 (two) times daily as needed for wheezing or shortness of breath. 05/01/21   Willeen Harold, MD  ferrous gluconate  [facility]) 324 MG tablet Take 1 tablet (324 mg total) by mouth daily with breakfast. 09/16/21   Walker, Jamilla R, CNM  ondansetron  (ZOFRAN ) 4 MG tablet Take 1 tablet (4 mg total) by mouth every 8 (eight) hours as needed for nausea or vomiting. 03/05/22   Rai, Hurman Maiden, MD  oxyCODONE  (OXY IR/ROXICODONE ) 5 MG immediate release tablet Take 1 tablet (5 mg total) by mouth every 6 (six) hours as needed for severe pain (not relieved by tylenol  or  advil ). 03/05/22   Simaan, Elizabeth S, PA-C  polyethylene glycol (MIRALAX  / GLYCOLAX ) 17 g packet Take 17 g by mouth daily as needed for mild constipation or moderate constipation. 03/05/22   Loma Rising, MD      Allergies    Flonase  [fluticasone  propionate]    Review of Systems   Review of Systems  All other systems reviewed and are negative.   Physical Exam Updated Vital Signs BP 122/77   Pulse 94   Temp 98.2 F (36.8 C)   Resp 16   LMP 03/29/2023 (Approximate)   SpO2 100%  Physical Exam Vitals and nursing note reviewed.  Constitutional:      General: She is not in acute distress.    Appearance: She is well-developed.  HENT:     Head: Normocephalic and atraumatic.  Eyes:     Conjunctiva/sclera: Conjunctivae normal.  Cardiovascular:     Rate and Rhythm: Normal rate and regular rhythm.     Heart sounds: No murmur heard. Pulmonary:     Effort: Pulmonary effort is normal. No respiratory distress.     Breath sounds: Normal breath sounds.  Abdominal:     Palpations: Abdomen is soft.     Tenderness: There is no abdominal tenderness.  Musculoskeletal:        General: No swelling.     Cervical back: Neck supple.     Comments: Patient able to extend right knee fully  but with pain with flexion around 15 to 20 degrees.  No obvious joint laxity appreciated anterior/posterior drawer, valgus/varus stress.  Patient with tenderness most so lateral joint line right knee as well as over the patella anteriorly.  No lower extremity edema.  No overlying erythema, palpable fluctuance/induration.  Pedal and posterior tibial pulses 2+ bilaterally.  No other tenderness bilateral upper or lower extremities.  No midline tenderness cervical, thoracic and lumbar spine without step-off or deformity.  No chest wall tenderness.  Skin:    General: Skin is warm and dry.     Capillary Refill: Capillary refill takes less than 2 seconds.  Neurological:     Mental Status: She is alert.  Psychiatric:         Mood and Affect: Mood normal.     ED Results / Procedures / Treatments   Labs (all labs ordered are listed, but only abnormal results are displayed) Labs Reviewed - No data to display  EKG None  Radiology No results found.  Procedures Procedures    Medications Ordered in ED Medications  acetaminophen  (TYLENOL ) tablet 650 mg (650 mg Oral Given 05/03/23 1131)    ED Course/ Medical Decision Making/ A&P                                 Medical Decision Making Amount and/or Complexity of Data Reviewed Radiology: ordered.  Risk OTC drugs.   This patient presents to the ED for concern of knee pain, this involves an extensive number of treatment options, and is a complaint that carries with it a high risk of complications and morbidity.  The differential diagnosis includes fracture, strain/sprain, dislocation, ligamentous/tendinous injury, neurovasc compromise, septic arthritis, osteoarthritis, gout, other     Co morbidities that complicate the patient evaluation   See HPI     Additional history obtained:   Additional history obtained from EMR External records from outside source obtained and reviewed including hospital records     Lab Tests:   N/a     Imaging Studies ordered:   I ordered imaging studies including right knee x-ray I independently visualized and interpreted imaging which showed no acute osseous abnormality. I agree with the radiologist interpretation     Cardiac Monitoring: / EKG:   N/a     Consultations Obtained:   N/a     Problem List / ED Course / Critical interventions / Medication management   Right knee pain Reevaluation of the patient showed that the patient stayed the same I have reviewed the patients home medicines and have made adjustments as needed     Social Determinants of Health:   Chronic cigarette use.  Denies illicit drug use.     Test / Admission - Considered:   Right knee pain Vitals signs within  normal range and stable throughout visit. Imaging studies significant for: See above 23 year old female presents emergency department after a fall.  States that she did not see one of her toddler's toys behind her and tripped when she was walking backwards.  States that she fell backwards.  Unsure of exactly which way her knee went.  Denies trauma to head, LOC or pain from incident elsewhere.  States that she has baseline pain in bilateral knee due to being "knock-knee."  Reports increased pain since then.  Does report feelings of knee giving out when she steps a certain way.  Denies any weakness or sensory deficits distal to injury.  Has taken nothing for her symptoms prior to arrival. On exam, lateral joint line tenderness of right knee as well as tenderness over patella.  Limited range of motion of flexion secondary to pain.  No pulse deficits to suggest ischemic limb.  Overlying skin changes concerning for secondary infectious process.  X-ray obtained which was negative for any osseous abnormality.  Difficult to assess joint laxity given duration of time since injury occurred as well as body habitus but concern for underlying ligamentous/meniscal injury given feelings of knee instability when walking.  Will place patient in knee immobilizer, crutches and recommend symptomatic therapy as described in AVS.  Will recommend close follow-up with orthopedics in the outpatient setting for reassessment.  Treatment plan discussed with the patient and she acknowledged understanding was agreeable to said plan.  Patient overall well-appearing, afebrile in no acute distress. Worrisome signs and symptoms were discussed with the patient, and the patient acknowledged understanding to return to the ED if noticed. Patient was stable upon discharge.         Final Clinical Impression(s) / ED Diagnoses Final diagnoses:  None    Rx / DC Orders ED Discharge Orders     None         [place]ale Butter,  [place] 05/03/23 1214    Guadalupe Lee, MD 05/04/23 [phone]

## 2023-05-03 NOTE — ED Triage Notes (Signed)
 pt c/o R knee pain after mechanical fall last night; states she tried to "sleep it off" last night, advises she now has limited ROM. Pain worse w movement, advises she is unable to bear weight. States "I'm knock-kneed anyway so it's hard enough & it hurts worse."    No meds PTA

## 2023-05-23 ENCOUNTER — Emergency Department (HOSPITAL_BASED_OUTPATIENT_CLINIC_OR_DEPARTMENT_OTHER)
Admission: EM | Admit: 2023-05-23 | Discharge: 2023-05-23 | Disposition: A | Discharge status: Home / Self Care | Attending: Emergency Medicine | Admitting: Emergency Medicine

## 2023-05-23 ENCOUNTER — Emergency Department (HOSPITAL_BASED_OUTPATIENT_CLINIC_OR_DEPARTMENT_OTHER): Admitting: Radiology

## 2023-05-23 ENCOUNTER — Other Ambulatory Visit: Payer: Self-pay

## 2023-05-23 DIAGNOSIS — S93492A Sprain of other ligament of left ankle, initial encounter: Secondary | ICD-10-CM | POA: Diagnosis not present

## 2023-05-23 DIAGNOSIS — X501XXA Overexertion from prolonged static or awkward postures, initial encounter: Secondary | ICD-10-CM | POA: Insufficient documentation

## 2023-05-23 DIAGNOSIS — Y92009 Unspecified place in unspecified non-institutional (private) residence as the place of occurrence of the external cause: Secondary | ICD-10-CM | POA: Diagnosis not present

## 2023-05-23 DIAGNOSIS — S93402A Sprain of unspecified ligament of left ankle, initial encounter: Secondary | ICD-10-CM

## 2023-05-23 DIAGNOSIS — Y9301 Activity, walking, marching and hiking: Secondary | ICD-10-CM | POA: Insufficient documentation

## 2023-05-23 DIAGNOSIS — S8392XA Sprain of unspecified site of left knee, initial encounter: Secondary | ICD-10-CM | POA: Diagnosis not present

## 2023-05-23 DIAGNOSIS — S8992XA Unspecified injury of left lower leg, initial encounter: Secondary | ICD-10-CM | POA: Diagnosis present

## 2023-05-23 MED ORDER — IBUPROFEN 400 MG PO TABS
600.0000 mg | ORAL_TABLET | Freq: Once | ORAL | Status: AC
  Administered 2023-05-23: 600 mg via ORAL
  Filled 2023-05-23: qty 1

## 2023-05-23 MED ORDER — ACETAMINOPHEN 500 MG PO TABS
1000.0000 mg | ORAL_TABLET | Freq: Once | ORAL | Status: AC
  Administered 2023-05-23: 1000 mg via ORAL
  Filled 2023-05-23: qty 2

## 2023-05-23 NOTE — ED Triage Notes (Addendum)
 Pt POV reporting lower left leg, ankle, and foot pain after tripping down stairs, states she heard a "crack", unable to bear weight.

## 2023-05-23 NOTE — ED Provider Notes (Signed)
 [place] EMERGENCY DEPARTMENT AT [facility] Provider Note   CSN: [id number] Arrival date & time: 05/23/23  1514     History  Chief Complaint  Patient presents with   Leg Injury    Jessica Solomon is a 23 y.o. female.  With a history of obesity presents to the ED for left lower extremity injury.  Patient recently suffered a sprain of her right knee.  For this reason she was favoring her left lower extremity while walking downstairs at home today.  She stepped awkwardly and twisted her left knee and left ankle.  Has had pain in this region with difficulty bearing weight since then.  No falls or other injury  HPI     Home Medications Prior to Admission medications   Medication Sig Start Date End Date Taking? Authorizing Provider  acetaminophen  (TYLENOL ) 500 MG tablet Take 2 tablets (1,000 mg total) by mouth every 6 (six) hours as needed. 03/05/22   Charlott Converse, PA-C  albuterol  (VENTOLIN  HFA) 108 (90 Base) MCG/ACT inhaler Inhale 2 puffs into the lungs every 6 (six) hours as needed for wheezing or shortness of breath. Patient taking differently: Inhale 2 puffs into the lungs 2 (two) times daily as needed for wheezing or shortness of breath. 05/01/21   Willeen Harold, MD  ferrous gluconate  [facility]) 324 MG tablet Take 1 tablet (324 mg total) by mouth daily with breakfast. 09/16/21   Walker, Jamilla R, CNM  ondansetron  (ZOFRAN ) 4 MG tablet Take 1 tablet (4 mg total) by mouth every 8 (eight) hours as needed for nausea or vomiting. 03/05/22   Rai, Hurman Maiden, MD  oxyCODONE  (OXY IR/ROXICODONE ) 5 MG immediate release tablet Take 1 tablet (5 mg total) by mouth every 6 (six) hours as needed for severe pain (not relieved by tylenol  or advil ). 03/05/22   Simaan, Elizabeth S, PA-C  polyethylene glycol (MIRALAX  / GLYCOLAX ) 17 g packet Take 17 g by mouth daily as needed for mild constipation or moderate constipation. 03/05/22   Rai, Hurman Maiden, MD      Allergies    Flonase  [fluticasone   propionate]    Review of Systems   Review of Systems  Physical Exam Updated Vital Signs BP 115/70   Pulse 81   Temp 98 F (36.7 C) (Oral)   Resp 18   Ht 5\' 6"  (1.676 m)   Wt 124.7 kg   LMP 05/04/2023 (Exact Date)   SpO2 100%   BMI 44.39 kg/m  Physical Exam Vitals and nursing note reviewed.  HENT:     Head: Normocephalic and atraumatic.  Eyes:     Pupils: Pupils are equal, round, and reactive to light.  Cardiovascular:     Rate and Rhythm: Normal rate and regular rhythm.  Pulmonary:     Effort: Pulmonary effort is normal.     Breath sounds: Normal breath sounds.  Abdominal:     Palpations: Abdomen is soft.     Tenderness: There is no abdominal tenderness.  Musculoskeletal:     Comments: Tenderness to deep palpation of left anterior knee Increased pain with extension of left knee Full active range of motion Tenderness over lateral malleolus of the left no deformity Full active range of motion through foot and ankle Sensation tact light touch throughout bilateral lower extremities 2+ DP pulse bilaterally No tenderness along left tib-fib  Skin:    General: Skin is warm and dry.  Neurological:     Mental Status: She is alert.  Psychiatric:  Mood and Affect: Mood normal.     ED Results / Procedures / Treatments   Labs (all labs ordered are listed, but only abnormal results are displayed) Labs Reviewed - No data to display  EKG None  Radiology DG Ankle Complete Left Result Date: 05/23/2023 CLINICAL DATA:  Left ankle pain after fall. EXAM: LEFT ANKLE COMPLETE - 3+ VIEW COMPARISON:  July 11, 2014. FINDINGS: There is no evidence of fracture, dislocation, or joint effusion. There is no evidence of arthropathy or other focal bone abnormality. Soft tissues are unremarkable. IMPRESSION: Negative. Electronically Signed   By: Rosalene Colon M.D.   On: 05/23/2023 16:40   DG Foot Complete Left Result Date: 05/23/2023 CLINICAL DATA:  Left foot pain after fall. EXAM:  LEFT FOOT - COMPLETE 3+ VIEW COMPARISON:  None Available. FINDINGS: There is no evidence of fracture or dislocation. There is no evidence of arthropathy or other focal bone abnormality. Soft tissues are unremarkable. IMPRESSION: Negative. Electronically Signed   By: Rosalene Colon M.D.   On: 05/23/2023 16:38   DG Tibia/Fibula Left Result Date: 05/23/2023 CLINICAL DATA:  Left leg pain after fall down stairs. EXAM: LEFT TIBIA AND FIBULA - 2 VIEW COMPARISON:  None Available. FINDINGS: There is no evidence of fracture or other focal bone lesions. Soft tissues are unremarkable. IMPRESSION: Negative. Electronically Signed   By: Rosalene Colon M.D.   On: 05/23/2023 16:36   DG Knee Complete 4 Views Left Result Date: 05/23/2023 CLINICAL DATA:  Left knee pain after fall down stairs. EXAM: LEFT KNEE - COMPLETE 4+ VIEW COMPARISON:  None Available. FINDINGS: No evidence of fracture, dislocation, or joint effusion. No evidence of arthropathy or other focal bone abnormality. Soft tissues are unremarkable. IMPRESSION: Negative. Electronically Signed   By: Rosalene Colon M.D.   On: 05/23/2023 16:34    Procedures Procedures    Medications Ordered in ED Medications  ibuprofen  (ADVIL ) tablet 600 mg (has no administration in time range)  acetaminophen  (TYLENOL ) tablet 1,000 mg (has no administration in time range)    ED Course/ Medical Decision Making/ A&P                                 Medical Decision Making 23 year old female presenting for left lower extremity injury after stepping awkwardly.  Knee pain ankle pain without bony deformity.  Some tenderness to the knee and over the left lateral malleolus.  Circulation motor or sensory all intact.  X-ray of the left knee left tib-fib left foot left ankle strain osseous injury.  Suspect this may be a sprain of both the left knee and left ankle.  Will discharge with Ace wrap for the left knee, brace for the left ankle and with crutches.  Instructed on  symptomatic management of sprain strain with Tylenol  Motrin  and will provide orthopedic follow-up if her symptoms do not improve over the next week  Amount and/or Complexity of Data Reviewed Radiology: ordered.  Risk OTC drugs.           Final Clinical Impression(s) / ED Diagnoses Final diagnoses:  Sprain of left knee, unspecified ligament, initial encounter  Sprain of left ankle, unspecified ligament, initial encounter    Rx / DC Orders ED Discharge Orders     None         Sallyanne Creamer, DO 05/23/23 1735

## 2023-05-23 NOTE — Discharge Instructions (Signed)
 You were seen for left leg injury after tripping The x-ray did not show any broken bones You may have sprained both your left knee and left ankle For this reason we gave an Ace wrap and an ankle brace and crutches Take Tylenol  Motrin  as directed for pain Rest apply ice and elevate the knee and ankle If your symptoms do not improve within 1 week follow-up with Dr. Cherl Corner with an orthopedic surgeon and make an appointment to be seen in the office Return to the Emergency Department for severe pain or any other concerns

## 2023-05-23 NOTE — ED Notes (Signed)
 RN reviewed discharge instructions with pt. Pt verbalized understanding and had no further questions. VSS upon discharge.

## 2023-06-01 IMAGING — US US OB COMP LESS 14 WK
1 series · 15 of 28 positions shown · non-contrast
Comparison: None.

CLINICAL DATA: Pregnant patient with lower abdominal pain.
Gestational age by LMP 6 weeks 1 day.

EXAM:
OBSTETRIC <14 WK US AND TRANSVAGINAL OB US
TECHNIQUE: Both transabdominal and transvaginal ultrasound examinations were
performed for complete evaluation of the gestation as well as the
maternal uterus, adnexal regions, and pelvic cul-de-sac.
Transvaginal technique was performed to assess early pregnancy.

[Series 1: us ob comp less 14 wks mc & wl · 15 of 64 slices shown]
[im 1/64]
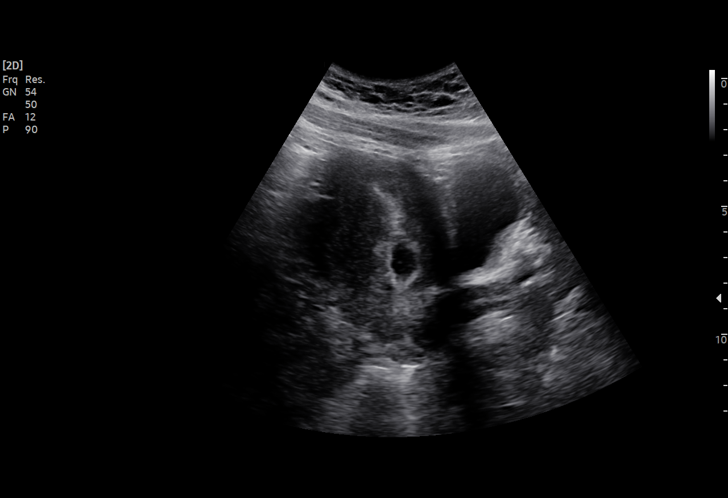
[im 5/64]
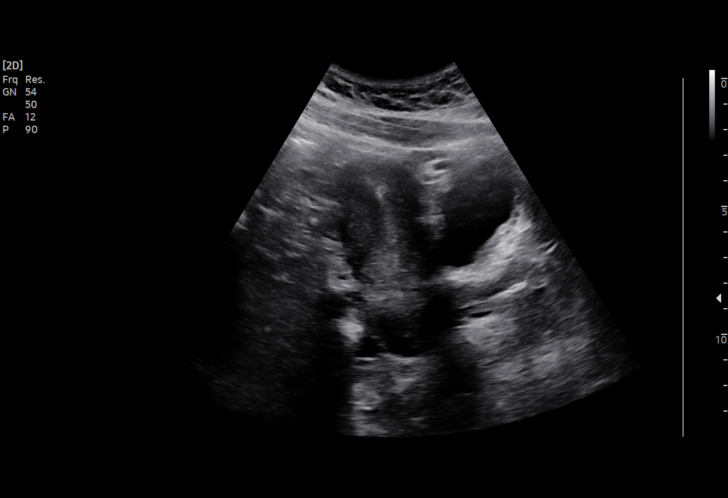
[im 10/64]
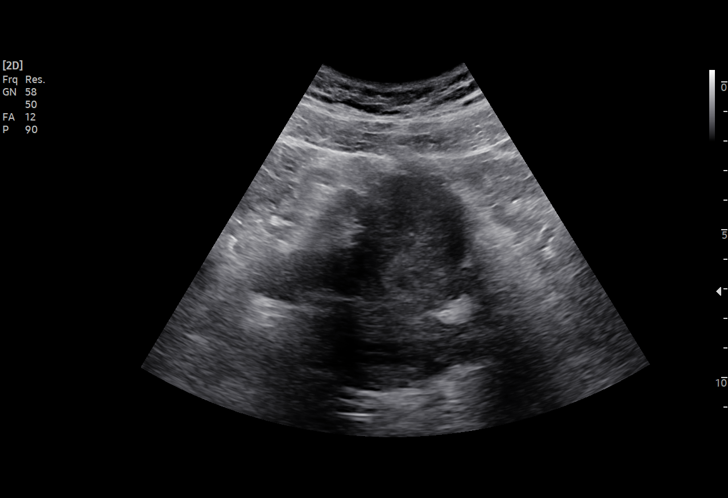
[im 15/64]
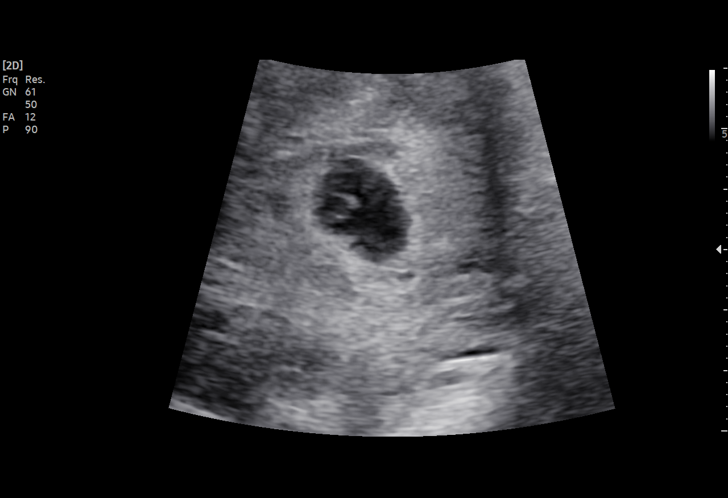
[im 19/64]
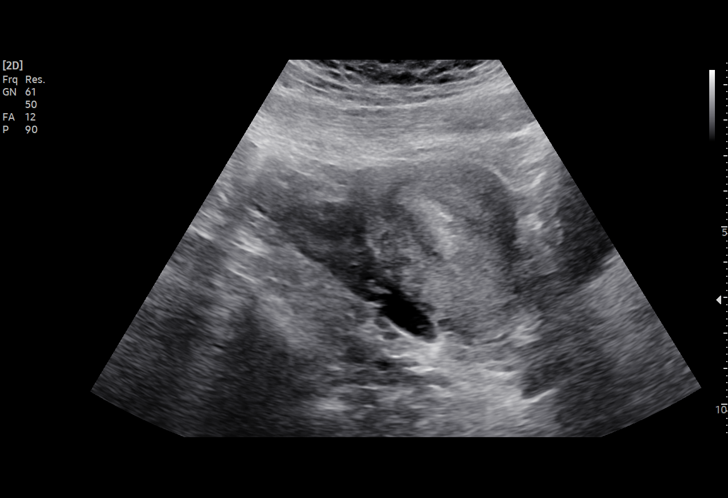
[im 24/64]
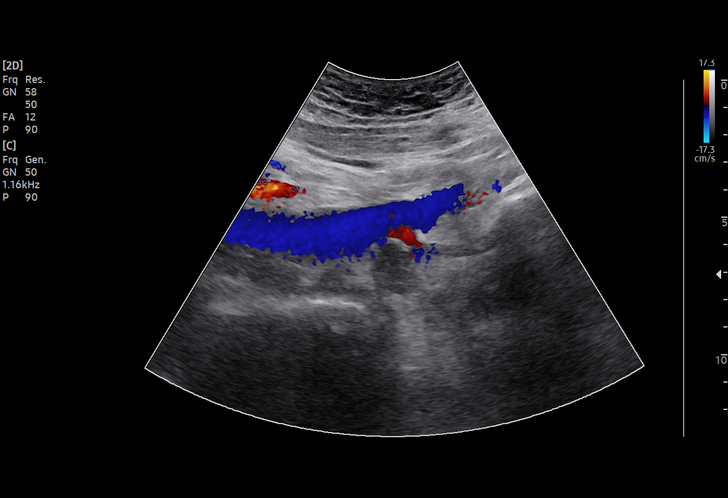
[im 29/64]
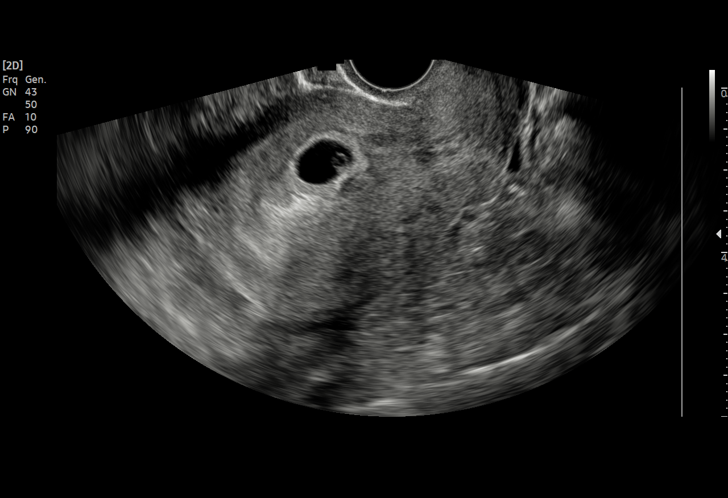
[im 33/64]
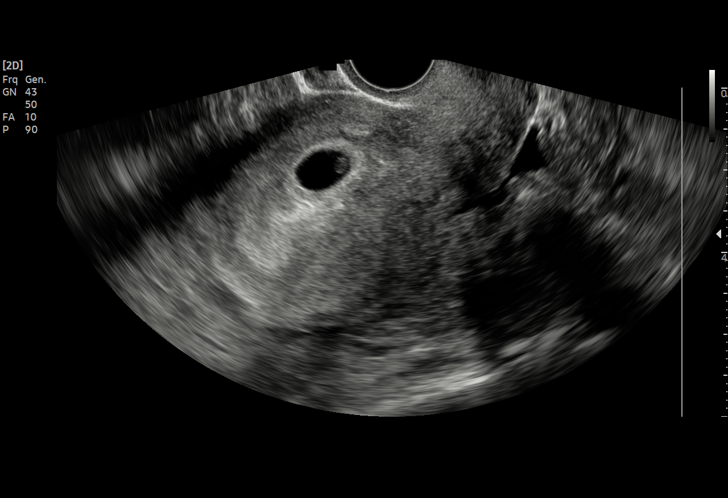
[im 36/64]
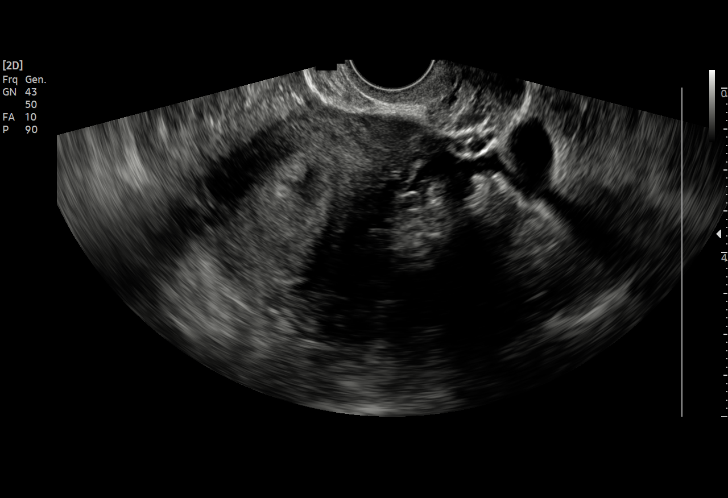
[im 40/64]
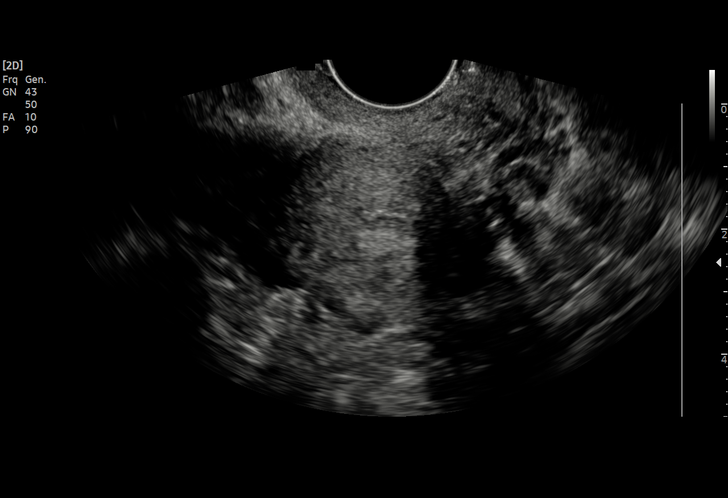
[im 45/64]
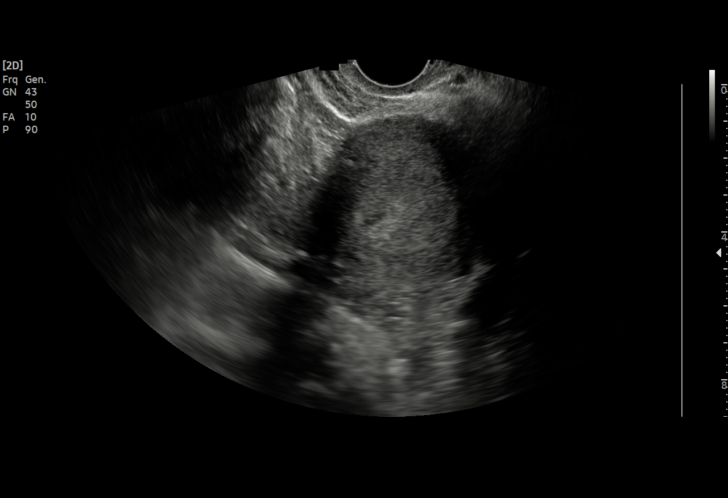
[im 50/64]
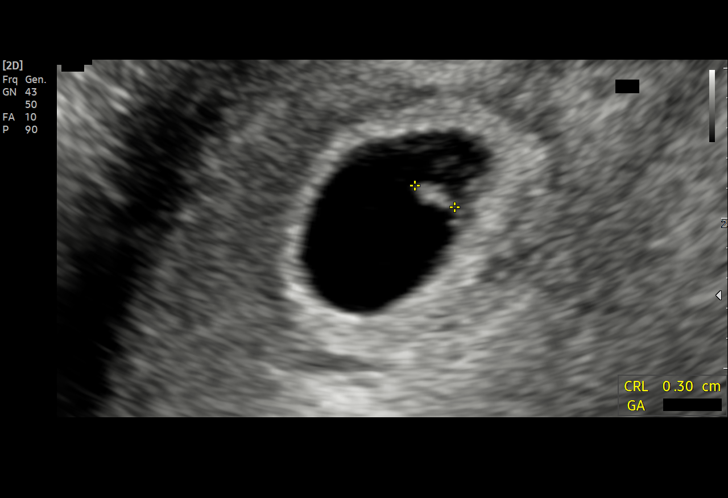
[im 54/64]
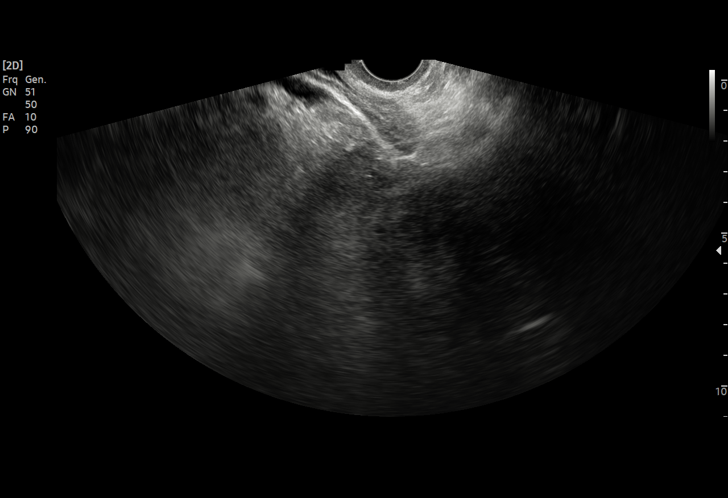
[im 59/64]
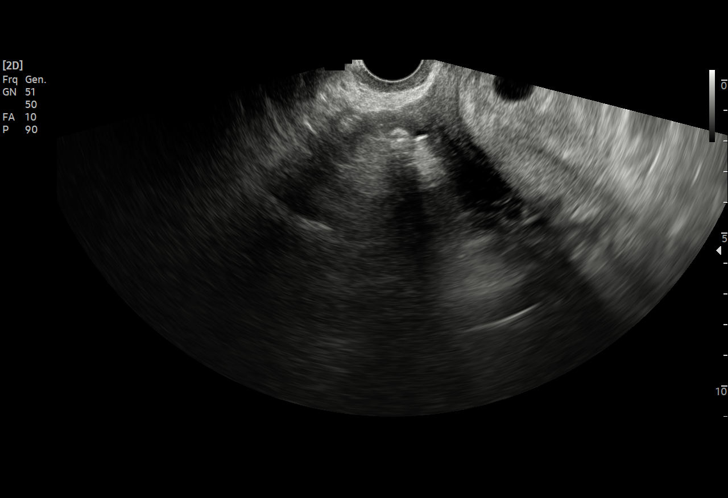
[im 64/64]
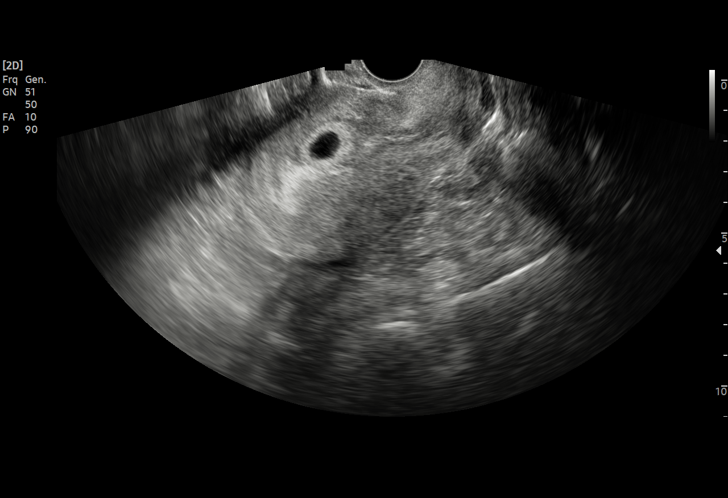

[15 of 28 positions shown; findings below may reference images not displayed]

FINDINGS: Intrauterine gestational sac: Single

Yolk sac:  Visualized.

Embryo:  Visualized.

Cardiac Activity: Visualized.

Heart Rate: 99 bpm

CRL:  3.1 mm   5 w   6 d                  US EDC: 10/23/2021

Subchorionic hemorrhage:  None visualized.

Maternal uterus/adnexae: The right ovary is not visualized. The left
ovary is only seen transabdominally and contains a corpus luteal
cyst. There is no pelvic free fluid or adnexal mass.
IMPRESSION: 1. Single live intrauterine pregnancy estimated gestational age
based on crown-rump length 5 weeks 6 days. Ultrasound EDC
10/23/2021.
2. Mild fetal bradycardia with heart rate of 99 beats per minute may
be due to early gestational age.
3. No subchorionic hemorrhage.
